# Patient Record
Sex: Female | Born: 1995 | Race: White | Hispanic: No | Marital: Married | Smoking: Never smoker
Health system: Southern US, Community
[De-identification: ages and names within clinical notes are randomized; demographics above are authoritative.]

## PROBLEM LIST (undated history)

## (undated) DIAGNOSIS — E119 Type 2 diabetes mellitus without complications: Secondary | ICD-10-CM

## (undated) DIAGNOSIS — O26649 Intrahepatic cholestasis of pregnancy, unspecified trimester: Secondary | ICD-10-CM

## (undated) HISTORY — DX: Type 2 diabetes mellitus without complications: E11.9

## (undated) HISTORY — PX: TYMPANOSTOMY TUBE PLACEMENT: SHX32

## (undated) HISTORY — PX: DENTAL RESTORATION/EXTRACTION WITH X-RAY: SHX5796

## (undated) HISTORY — PX: OTHER SURGICAL HISTORY: SHX169

---
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
## 2015-11-16 ENCOUNTER — Encounter: Payer: Self-pay | Admitting: Women's Health

## 2015-11-16 ENCOUNTER — Ambulatory Visit (INDEPENDENT_AMBULATORY_CARE_PROVIDER_SITE_OTHER): Payer: 59 | Admitting: Women's Health

## 2015-11-16 VITALS — BP 118/60 | HR 88 | Wt 133.0 lb

## 2015-11-16 DIAGNOSIS — Z30011 Encounter for initial prescription of contraceptive pills: Secondary | ICD-10-CM

## 2015-11-16 DIAGNOSIS — Z3202 Encounter for pregnancy test, result negative: Secondary | ICD-10-CM

## 2015-11-16 DIAGNOSIS — E109 Type 1 diabetes mellitus without complications: Secondary | ICD-10-CM | POA: Insufficient documentation

## 2015-11-16 LAB — POCT URINE PREGNANCY: PREG TEST UR: NEGATIVE

## 2015-11-16 MED ORDER — NORETHIN-ETH ESTRAD-FE BIPHAS 1 MG-10 MCG / 10 MCG PO TABS: 1.0000 | ORAL_TABLET | Freq: Every day | ORAL | Status: DC

## 2015-11-16 NOTE — Patient Instructions (Signed)
Oral Contraception Use Oral contraceptive pills (OCPs) are medicines taken to prevent pregnancy. OCPs work by preventing the ovaries from releasing eggs. The hormones in OCPs also cause the cervical mucus to thicken, preventing the sperm from entering the uterus. The hormones also cause the uterine lining to become thin, not allowing a fertilized egg to attach to the inside of the uterus. OCPs are highly effective when taken exactly as prescribed. However, OCPs do not prevent sexually transmitted diseases (STDs). Safe sex practices, such as using condoms along with an OCP, can help prevent STDs. Before taking OCPs, you may have a physical exam and Pap test. Your health care provider may also order blood tests if necessary. Your health care provider will make sure you are a good candidate for oral contraception. Discuss with your health care provider the possible side effects of the OCP you may be prescribed. When starting an OCP, it can take 2 to 3 months for the body to adjust to the changes in hormone levels in your body.  HOW TO TAKE ORAL CONTRACEPTIVE PILLS Your health care provider may advise you on how to start taking the first cycle of OCPs. Otherwise, you can:   Start on day 1 of your menstrual period. You will not need any backup contraceptive protection with this start time.   Start on the first Sunday after your menstrual period or the day you get your prescription. In these cases, you will need to use backup contraceptive protection for the first week.   Start the pill at any time of your cycle. If you take the pill within 5 days of the start of your period, you are protected against pregnancy right away. In this case, you will not need a backup form of birth control. If you start at any other time of your menstrual cycle, you will need to use another form of birth control for 7 days. If your OCP is the type called a minipill, it will protect you from pregnancy after taking it for 2 days (48  hours). After you have started taking OCPs:   If you forget to take 1 pill, take it as soon as you remember. Take the next pill at the regular time.   If you miss 2 or more pills, call your health care provider because different pills have different instructions for missed doses. Use backup birth control until your next menstrual period starts.   If you use a 28-day pack that contains inactive pills and you miss 1 of the last 7 pills (pills with no hormones), it will not matter. Throw away the rest of the non-hormone pills and start a new pill pack.  No matter which day you start the OCP, you will always start a new pack on that same day of the week. Have an extra pack of OCPs and a backup contraceptive method available in case you miss some pills or lose your OCP pack.  HOME CARE INSTRUCTIONS   Do not smoke.   Always use a condom to protect against STDs. OCPs do not protect against STDs.   Use a calendar to mark your menstrual period days.   Read the information and directions that came with your OCP. Talk to your health care provider if you have questions.  SEEK MEDICAL CARE IF:   You develop nausea and vomiting.   You have abnormal vaginal discharge or bleeding.   You develop a rash.   You miss your menstrual period.   You are losing   your hair.   You need treatment for mood swings or depression.   You get dizzy when taking the OCP.   You develop acne from taking the OCP.   You become pregnant.  SEEK IMMEDIATE MEDICAL CARE IF:   You develop chest pain.   You develop shortness of breath.   You have an uncontrolled or severe headache.   You develop numbness or slurred speech.   You develop visual problems.   You develop pain, redness, and swelling in the legs.    This information is not intended to replace advice given to you by your health care provider. Make sure you discuss any questions you have with your health care provider.   Document  Released: 06/15/2011 Document Revised: 07/17/2014 Document Reviewed: 12/15/2012 Elsevier Interactive Patient Education 2016 [employer]  Ethinyl Estradiol; Norethindrone Acetate tablets (contraception) What is this medicine? ETHINYL ESTRADIOL; NORETHINDRONE ACETATE (ETH in il es tra DYE ole; nor eth IN drone AS e tate) is an oral contraceptive. The products combine two types of female hormones, an estrogen and a progestin. They are used to prevent ovulation and pregnancy. This medicine may be used for other purposes; ask your health care provider or pharmacist if you have questions. What should I tell my health care provider before I take this medicine? They need to know if you have or ever had any of these conditions: -abnormal vaginal bleeding -blood vessel disease or blood clots -breast, cervical, endometrial, ovarian, liver, or uterine cancer -diabetes -gallbladder disease -heart disease or recent heart attack -high blood pressure -high cholesterol -kidney disease -liver disease -migraine headaches -stroke -systemic lupus erythematosus (SLE) -tobacco smoker -an unusual or allergic reaction to estrogens, progestins, other medicines, foods, dyes, or preservatives -pregnant or trying to get pregnant -breast-feeding How should I use this medicine? Take this medicine by mouth. To reduce nausea, this medicine may be taken with food. Follow the directions on the prescription label. Take this medicine at the same time each day and in the order directed on the package. Do not take your medicine more often than directed. Contact your pediatrician regarding the use of this medicine in children. Special care may be needed. This medicine has been used in female children who have started having menstrual periods. A patient package insert for the product will be given with each prescription and refill. Read this sheet carefully each time. The sheet may change frequently. Overdosage: If you  think you have taken too much of this medicine contact a poison control center or emergency room at once. NOTE: This medicine is only for you. Do not share this medicine with others. What if I miss a dose? If you miss a dose, refer to the patient information sheet you received with your medicine for direction. If you miss more than one pill, this medicine may not be as effective and you may need to use another form of birth control. What may interact with this medicine? -acetaminophen -antibiotics or medicines for infections, especially rifampin, rifabutin, rifapentine, and griseofulvin, and possibly penicillins or tetracyclines -aprepitant -ascorbic acid (vitamin C) -atorvastatin -barbiturate medicines, such as phenobarbital -bosentan -carbamazepine -caffeine -clofibrate -cyclosporine -dantrolene -doxercalciferol -felbamate -grapefruit juice -hydrocortisone -medicines for anxiety or sleeping problems, such as diazepam or temazepam -medicines for diabetes, including pioglitazone -mineral oil -modafinil -mycophenolate -nefazodone -oxcarbazepine -phenytoin -prednisolone -ritonavir or other medicines for HIV infection or AIDS -rosuvastatin -selegiline -soy isoflavones supplements -St. John's wort -tamoxifen or raloxifene -theophylline -thyroid hormones -topiramate -warfarin This list may not describe all   possible interactions. Give your health care provider a list of all the medicines, herbs, non-prescription drugs, or dietary supplements you use. Also tell them if you smoke, drink alcohol, or use illegal drugs. Some items may interact with your medicine. What should I watch for while using this medicine? Visit your doctor or health care professional for regular checks on your progress. You will need a regular breast and pelvic exam and Pap smear while on this medicine. Use an additional method of contraception during the first cycle that you take these tablets. If you have  any reason to think you are pregnant, stop taking this medicine right away and contact your doctor or health care professional. If you are taking this medicine for hormone related problems, it may take several cycles of use to see improvement in your condition. Smoking increases the risk of getting a blood clot or having a stroke while you are taking birth control pills, especially if you are more than 21 years old. You are strongly advised not to smoke. This medicine can make your body retain fluid, making your fingers, hands, or ankles swell. Your blood pressure can go up. Contact your doctor or health care professional if you feel you are retaining fluid. This medicine can make you more sensitive to the sun. Keep out of the sun. If you cannot avoid being in the sun, wear protective clothing and use sunscreen. Do not use sun lamps or tanning beds/booths. If you wear contact lenses and notice visual changes, or if the lenses begin to feel uncomfortable, consult your eye care specialist. In some women, tenderness, swelling, or minor bleeding of the gums may occur. Notify your dentist if this happens. Brushing and flossing your teeth regularly may help limit this. See your dentist regularly and inform your dentist of the medicines you are taking. If you are going to have elective surgery, you may need to stop taking this medicine before the surgery. Consult your health care professional for advice. This medicine does not protect you against HIV infection (AIDS) or any other sexually transmitted diseases. What side effects may I notice from receiving this medicine? Side effects that you should report to your doctor or health care professional as soon as possible: -breast tissue changes or discharge -changes in vaginal bleeding during your period or between your periods -chest pain -coughing up blood -dizziness or fainting spells -headaches or migraines -leg, arm or groin pain -severe or sudden  headaches -stomach pain (severe) -sudden shortness of breath -sudden loss of coordination, especially on one side of the body -speech problems -symptoms of vaginal infection like itching, irritation or unusual discharge -tenderness in the upper abdomen -vomiting -weakness or numbness in the arms or legs, especially on one side of the body -yellowing of the eyes or skin Side effects that usually do not require medical attention (report to your doctor or health care professional if they continue or are bothersome): -breakthrough bleeding and spotting that continues beyond the 3 initial cycles of pills -breast tenderness -mood changes, anxiety, depression, frustration, anger, or emotional outbursts -increased sensitivity to sun or ultraviolet light -nausea -skin rash, acne, or brown spots on the skin -weight gain (slight) This list may not describe all possible side effects. Call your doctor for medical advice about side effects. You may report side effects to FDA at 1-800-FDA-1088. Where should I keep my medicine? Keep out of the reach of children. Store at room temperature between 15 and 30 degrees C (59 and 86 degrees F).   Throw away any unused medicine after the expiration date. NOTE: This sheet is a summary. It may not cover all possible information. If you have questions about this medicine, talk to your doctor, pharmacist, or health care provider.    2016, Elsevier/Gold Standard. (2012-11-01 15:35:20)  

## 2015-11-16 NOTE — Progress Notes (Signed)
Patient ID: Joan Gonzalez, female   DOB: [birth year], 20 y.o.   MRN: [id number]   [facility]  Patient name: Joan Gonzalez MRN [id number]  Date of birth: [birth year]  CC & HPI:  Joan Gonzalez is a 20 y.o. G0 Caucasian female presenting today for wanting to get on coc's. Is sexually active, uses condoms, has never been on contraception before. Has regular periods w/ normal flow. Discussed all other options, still desires coc's. Does not smoke, no h/o HTN, DVT/PE, CVA, MI, or migraines w/ aura. Turns 20yo in 10d.   Patient's last menstrual period was 10/20/2015. Pertinent History Reviewed:  Medical & Surgical Hx:   Past medical, surgical, family, and social history reviewed in electronic medical record Medications: Reviewed & Updated - see associated section Allergies: Reviewed in electronic medical record  Objective Findings:  Vitals: BP 118/60 mmHg  Pulse 88  Wt 133 lb (60.328 kg)  LMP 10/20/2015 There is no height on file to calculate BMI.  Physical Examination: General appearance - alert, well appearing, and in no distress HRRR LCTAB  Results for orders placed or performed in visit on 11/16/15 (from the past 24 hour(s))  POCT urine pregnancy   Collection Time: 11/16/15  4:16 PM  Result Value Ref Range   Preg Test, Ur Negative Negative     Assessment & Plan:  A:   Contraception management  P:  Rx LoLoestrin 3pack w/ 3RF  Condoms always for STI prevention  Return in about 3 months (around 02/16/2016) for Pap & physical. and coc f/u  Marge DuncansBooker, Reza Crymes Randall CNM, WHNP-BC 11/16/2015 4:25 PM

## 2016-02-15 ENCOUNTER — Telehealth: Payer: Self-pay | Admitting: Women's Health

## 2016-02-15 NOTE — Telephone Encounter (Signed)
Pt states her BCP this month is going to be $60, last time she got them they were free.  [facility] and she states the discount card that was on file expired last month.  Gave pharmacy and pt the information from the new discount card, pharmacist states once pt activates the card she can run RX through again.  Informed pt and she verbalized understanding.

## 2016-02-15 NOTE — Telephone Encounter (Signed)
Pt called stating that she would like a refill of her birth control and pt would like the generic of her birth control. Please contact pt

## 2016-02-16 ENCOUNTER — Other Ambulatory Visit: Payer: 59 | Admitting: Adult Health

## 2016-04-19 ENCOUNTER — Telehealth: Payer: Self-pay | Admitting: Family Medicine

## 2016-04-19 DIAGNOSIS — Z13220 Encounter for screening for lipoid disorders: Secondary | ICD-10-CM

## 2016-04-19 DIAGNOSIS — R5383 Other fatigue: Secondary | ICD-10-CM

## 2016-04-19 DIAGNOSIS — Z79899 Other long term (current) drug therapy: Secondary | ICD-10-CM

## 2016-04-19 NOTE — Telephone Encounter (Signed)
Pt is requesting lab orders to be sent over for an upcoming physical. Pt is a new pt here and has no previous labs with us in epic.

## 2016-04-19 NOTE — Telephone Encounter (Signed)
Please discuss with the patient his current health issues. According to Epic he is on insulin? I assume he has a endocrinologist? Does he know if the endocrinologist is check cholesterol recently? May need various testing depending on the reply

## 2016-04-19 NOTE — Telephone Encounter (Signed)
Left message return call 04/19/16 

## 2016-04-20 NOTE — Telephone Encounter (Signed)
Yes patient states she takes insulin. She does see a endocrinologist. Patient is not sure if they check her cholesterol. She does not follow up with endocrinology until November.

## 2016-04-20 NOTE — Telephone Encounter (Signed)
I would recommend A1c should be done through her endocrinologist when she sees him in November. As for us I recommend CBC, lipid, liver, metabolic 7, TSH

## 2016-04-21 NOTE — Addendum Note (Signed)
Addended by: Dereck LigasJOHNSON, Tuck Dulworth P on: 04/21/2016 08:09 AM   Modules accepted: Orders

## 2016-04-21 NOTE — Telephone Encounter (Signed)
Left message on voicemail notifying patient that blood work has been ordered.  

## 2016-04-30 LAB — HEPATIC FUNCTION PANEL
ALK PHOS: 55 IU/L (ref 39–117)
ALT: 15 IU/L (ref 0–32)
AST: 17 IU/L (ref 0–40)
Albumin: 4.4 g/dL (ref 3.5–5.5)
BILIRUBIN TOTAL: 0.5 mg/dL (ref 0.0–1.2)
BILIRUBIN, DIRECT: 0.15 mg/dL (ref 0.00–0.40)
Total Protein: 7.1 g/dL (ref 6.0–8.5)

## 2016-04-30 LAB — BASIC METABOLIC PANEL
BUN / CREAT RATIO: 28 — AB (ref 9–23)
BUN: 22 mg/dL — AB (ref 6–20)
CHLORIDE: 98 mmol/L (ref 96–106)
CO2: 22 mmol/L (ref 18–29)
Calcium: 9.7 mg/dL (ref 8.7–10.2)
Creatinine, Ser: 0.8 mg/dL (ref 0.57–1.00)
GFR calc non Af Amer: 106 mL/min/{1.73_m2} (ref >59)
GFR, EST AFRICAN AMERICAN: 122 mL/min/{1.73_m2} (ref >59)
Glucose: 241 mg/dL — ABNORMAL HIGH (ref 65–99)
Potassium: 5 mmol/L (ref 3.5–5.2)
SODIUM: 137 mmol/L (ref 134–144)

## 2016-04-30 LAB — CBC WITH DIFFERENTIAL/PLATELET
BASOS ABS: 0 10*3/uL (ref 0.0–0.2)
Basos: 0 %
EOS (ABSOLUTE): 0.2 10*3/uL (ref 0.0–0.4)
Eos: 3 %
HEMATOCRIT: 41.1 % (ref 34.0–46.6)
HEMOGLOBIN: 13.5 g/dL (ref 11.1–15.9)
Immature Grans (Abs): 0 10*3/uL (ref 0.0–0.1)
Immature Granulocytes: 0 %
LYMPHS ABS: 1.9 10*3/uL (ref 0.7–3.1)
Lymphs: 27 %
MCH: 29.5 pg (ref 26.6–33.0)
MCHC: 32.8 g/dL (ref 31.5–35.7)
MCV: 90 fL (ref 79–97)
MONOCYTES: 6 %
MONOS ABS: 0.4 10*3/uL (ref 0.1–0.9)
NEUTROS ABS: 4.4 10*3/uL (ref 1.4–7.0)
Neutrophils: 64 %
Platelets: 430 10*3/uL — ABNORMAL HIGH (ref 150–379)
RBC: 4.57 x10E6/uL (ref 3.77–5.28)
RDW: 12.7 % (ref 12.3–15.4)
WBC: 6.9 10*3/uL (ref 3.4–10.8)

## 2016-04-30 LAB — TSH

## 2016-04-30 LAB — LIPID PANEL
CHOLESTEROL TOTAL: 211 mg/dL — AB (ref 100–199)
Chol/HDL Ratio: 2.2 ratio units (ref 0.0–4.4)
HDL: 94 mg/dL (ref >39)
LDL Calculated: 97 mg/dL (ref 0–99)
TRIGLYCERIDES: 98 mg/dL (ref 0–149)
VLDL Cholesterol Cal: 20 mg/dL (ref 5–40)

## 2016-05-03 ENCOUNTER — Encounter: Payer: 59 | Admitting: Nurse Practitioner

## 2016-05-10 ENCOUNTER — Telehealth: Payer: Self-pay | Admitting: *Deleted

## 2016-05-10 MED ORDER — NORETHIN ACE-ETH ESTRAD-FE 1-20 MG-MCG(24) PO TABS: 1.0000 | ORAL_TABLET | Freq: Every day | ORAL | 11 refills | Status: DC

## 2016-05-10 NOTE — Telephone Encounter (Signed)
Will rx loestrin 24 fe

## 2016-05-30 ENCOUNTER — Ambulatory Visit (INDEPENDENT_AMBULATORY_CARE_PROVIDER_SITE_OTHER): Payer: PRIVATE HEALTH INSURANCE | Admitting: Nurse Practitioner

## 2016-05-30 ENCOUNTER — Encounter: Payer: Self-pay | Admitting: Nurse Practitioner

## 2016-05-30 VITALS — BP 120/72 | Ht 62.0 in | Wt 139.0 lb

## 2016-05-30 DIAGNOSIS — Z124 Encounter for screening for malignant neoplasm of cervix: Secondary | ICD-10-CM

## 2016-05-30 DIAGNOSIS — Z113 Encounter for screening for infections with a predominantly sexual mode of transmission: Secondary | ICD-10-CM

## 2016-05-30 DIAGNOSIS — Z1151 Encounter for screening for human papillomavirus (HPV): Secondary | ICD-10-CM | POA: Diagnosis not present

## 2016-05-30 DIAGNOSIS — Z0000 Encounter for general adult medical examination without abnormal findings: Secondary | ICD-10-CM | POA: Diagnosis not present

## 2016-06-01 ENCOUNTER — Encounter: Payer: Self-pay | Admitting: Nurse Practitioner

## 2016-06-01 NOTE — Progress Notes (Signed)
   Subjective:    Patient ID: Joan Gonzalez, female    DOB: 31-May-1996, 20 y.o.   MRN: [id number]  HPI Presents for her wellness exam. Has insulin pump. Sees her endocrinologist every 3 months. Married; has been a long term partner. Cycles very light on current pill. No missed pills. No pelvic pain or discharge. Regular eye exams. Needs dental exam. Walking for activity.     Review of Systems  Constitutional: Negative for activity change, appetite change and fatigue.  HENT: Negative for dental problem, ear pain, sinus pressure and sore throat.   Respiratory: Negative for cough, chest tightness, shortness of breath and wheezing.   Cardiovascular: Negative for chest pain.  Gastrointestinal: Negative for abdominal distention, abdominal pain, constipation, diarrhea, nausea and vomiting.  Genitourinary: Negative for difficulty urinating, dysuria, enuresis, frequency, genital sores, menstrual problem, pelvic pain, urgency and vaginal discharge.       Objective:   Physical Exam  Constitutional: She is oriented to person, place, and time. She appears well-developed. No distress.  HENT:  Right Ear: External ear normal.  Left Ear: External ear normal.  Mouth/Throat: Oropharynx is clear and moist.  Neck: Normal range of motion. Neck supple. No tracheal deviation present. No thyromegaly present.  Cardiovascular: Normal rate, regular rhythm and normal heart sounds.  Exam reveals no gallop.   No murmur heard. Pulmonary/Chest: Effort normal and breath sounds normal.  Abdominal: Soft. She exhibits no distension. There is no tenderness.  Genitourinary: Vagina normal and uterus normal. No vaginal discharge found.  Genitourinary Comments: External GU: no rash or lesions. Vagina: no discharge. Cervix normal in appearance. No CMT. Bimanual exam: no tenderness or obvious masses.   Musculoskeletal: She exhibits no edema.  Lymphadenopathy:    She has no cervical adenopathy.  Neurological: She is alert  and oriented to person, place, and time.  Skin: Skin is warm and dry. No rash noted.  Psychiatric: She has a normal mood and affect. Her behavior is normal.  Vitals reviewed. Breast exam: dense tissue with some small cystic areas. No masses. Axillae no adenopathy.         Assessment & Plan:  Routine general medical examination at a health care facility - Plan: Pap IG, CT/NG NAA, and HPV (high risk)  Screening for cervical cancer - Plan: Pap IG, CT/NG NAA, and HPV (high risk)  Screen for STD (sexually transmitted disease) - Plan: Pap IG, CT/NG NAA, and HPV (high risk)  Screening for HPV (human papillomavirus) - Plan: Pap IG, CT/NG NAA, and HPV (high risk)  Encouraged healthy diet and activity.  Return in about 1 year (around 05/30/2017) for physical.

## 2016-06-06 LAB — PAP IG, CT-NG NAA, HPV HIGH-RISK
Chlamydia, Nuc. Acid Amp: NEGATIVE
Gonococcus by Nucleic Acid Amp: NEGATIVE
HPV, high-risk: NEGATIVE
PAP SMEAR COMMENT: 0

## 2016-06-07 ENCOUNTER — Encounter: Payer: PRIVATE HEALTH INSURANCE | Attending: Internal Medicine | Admitting: *Deleted

## 2016-06-07 DIAGNOSIS — E109 Type 1 diabetes mellitus without complications: Secondary | ICD-10-CM

## 2016-06-12 NOTE — Progress Notes (Signed)
Pump Upgrade Training: Appt start time: 1530 end time: 1700.   Assessment: Primary concerns today: Patient here for upgrade to Medtronic 670G  insulin pump Manual Mode. She does not have the G3 Sensors yet, they should be shipped in January. Patient has not completed training modules from [employer]. She states her comfort with Carb Counting is 7/10. She is here with her mother and her husband   Medications: see list. Insulin used in pump is Humalog  Intervention:  Pump settings transferred directly from current pump to new pump by patient New meter ID added to pump for communicating Reviewed new features of pump with patient Patient is not signed up on [facility] Web Site Patient expressed understanding of info taught and is wearing pump by end of visit. Patient informed of DM 1/PUmp Support Group  Follow Up  Patient to upload to [facility] within 14 days and notify me to assess for any pump setting changes.

## 2017-02-05 ENCOUNTER — Encounter: Payer: Self-pay | Admitting: Nurse Practitioner

## 2017-02-05 ENCOUNTER — Ambulatory Visit (INDEPENDENT_AMBULATORY_CARE_PROVIDER_SITE_OTHER): Payer: PRIVATE HEALTH INSURANCE | Admitting: Nurse Practitioner

## 2017-02-05 VITALS — BP 132/86 | Ht 62.5 in | Wt 146.0 lb

## 2017-02-05 DIAGNOSIS — N921 Excessive and frequent menstruation with irregular cycle: Secondary | ICD-10-CM

## 2017-02-05 DIAGNOSIS — R42 Dizziness and giddiness: Secondary | ICD-10-CM

## 2017-02-05 DIAGNOSIS — Z3009 Encounter for other general counseling and advice on contraception: Secondary | ICD-10-CM

## 2017-02-05 LAB — POCT HEMOGLOBIN: HEMOGLOBIN: 13 g/dL (ref 12.2–16.2)

## 2017-02-05 LAB — POCT URINE PREGNANCY: PREG TEST UR: NEGATIVE

## 2017-02-05 NOTE — Patient Instructions (Addendum)
Depo Provera Nexplanon Kyleena Nuvaring  Call and schedule nurse visit during first 4-5 days of cycle for depo Provera injection   Medroxyprogesterone injection [Contraceptive] What is this medicine? MEDROXYPROGESTERONE (me DROX ee proe JES te rone) contraceptive injections prevent pregnancy. They provide effective birth control for 3 months. Depo-subQ Provera 104 is also used for treating pain related to endometriosis. This medicine may be used for other purposes; ask your health care provider or pharmacist if you have questions. COMMON BRAND NAME(S): Depo-Provera, Depo-subQ Provera 104 What should I tell my health care provider before I take this medicine? They need to know if you have any of these conditions: -frequently drink alcohol -asthma -blood vessel disease or a history of a blood clot in the lungs or legs -bone disease such as osteoporosis -breast cancer -diabetes -eating disorder (anorexia nervosa or bulimia) -high blood pressure -HIV infection or AIDS -kidney disease -liver disease -mental depression -migraine -seizures (convulsions) -stroke -tobacco smoker -vaginal bleeding -an unusual or allergic reaction to medroxyprogesterone, other hormones, medicines, foods, dyes, or preservatives -pregnant or trying to get pregnant -breast-feeding How should I use this medicine? Depo-Provera Contraceptive injection is given into a muscle. Depo-subQ Provera 104 injection is given under the skin. These injections are given by a health care professional. You must not be pregnant before getting an injection. The injection is usually given during the first 5 days after the start of a menstrual period or 6 weeks after delivery of a baby. Talk to your pediatrician regarding the use of this medicine in children. Special care may be needed. These injections have been used in female children who have started having menstrual periods. Overdosage: If you think you have taken too much of  this medicine contact a poison control center or emergency room at once. NOTE: This medicine is only for you. Do not share this medicine with others. What if I miss a dose? Try not to miss a dose. You must get an injection once every 3 months to maintain birth control. If you cannot keep an appointment, call and reschedule it. If you wait longer than 13 weeks between Depo-Provera contraceptive injections or longer than 14 weeks between Depo-subQ Provera 104 injections, you could get pregnant. Use another method for birth control if you miss your appointment. You may also need a pregnancy test before receiving another injection. What may interact with this medicine? Do not take this medicine with any of the following medications: -bosentan This medicine may also interact with the following medications: -aminoglutethimide -antibiotics or medicines for infections, especially rifampin, rifabutin, rifapentine, and griseofulvin -aprepitant -barbiturate medicines such as phenobarbital or primidone -bexarotene -carbamazepine -medicines for seizures like ethotoin, felbamate, oxcarbazepine, phenytoin, topiramate -modafinil -St. John's wort This list may not describe all possible interactions. Give your health care provider a list of all the medicines, herbs, non-prescription drugs, or dietary supplements you use. Also tell them if you smoke, drink alcohol, or use illegal drugs. Some items may interact with your medicine. What should I watch for while using this medicine? This drug does not protect you against HIV infection (AIDS) or other sexually transmitted diseases. Use of this product may cause you to lose calcium from your bones. Loss of calcium may cause weak bones (osteoporosis). Only use this product for more than 2 years if other forms of birth control are not right for you. The longer you use this product for birth control the more likely you will be at risk for weak bones. Ask your health care  professional how you can keep strong bones. You may have a change in bleeding pattern or irregular periods. Many females stop having periods while taking this drug. If you have received your injections on time, your chance of being pregnant is very low. If you think you may be pregnant, see your health care professional as soon as possible. Tell your health care professional if you want to get pregnant within the next year. The effect of this medicine may last a long time after you get your last injection. What side effects may I notice from receiving this medicine? Side effects that you should report to your doctor or health care professional as soon as possible: -allergic reactions like skin rash, itching or hives, swelling of the face, lips, or tongue -breast tenderness or discharge -breathing problems -changes in vision -depression -feeling faint or lightheaded, falls -fever -pain in the abdomen, chest, groin, or leg -problems with balance, talking, walking -unusually weak or tired -yellowing of the eyes or skin Side effects that usually do not require medical attention (report to your doctor or health care professional if they continue or are bothersome): -acne -fluid retention and swelling -headache -irregular periods, spotting, or absent periods -temporary pain, itching, or skin reaction at site where injected -weight gain This list may not describe all possible side effects. Call your doctor for medical advice about side effects. You may report side effects to FDA at 1-800-FDA-1088. Where should I keep my medicine? This does not apply. The injection will be given to you by a health care professional. NOTE: This sheet is a summary. It may not cover all possible information. If you have questions about this medicine, talk to your doctor, pharmacist, or health care provider.  2018 Elsevier/Gold Standard (2008-07-17 18:37:56)

## 2017-02-06 ENCOUNTER — Encounter: Payer: Self-pay | Admitting: Nurse Practitioner

## 2017-02-06 NOTE — Progress Notes (Signed)
Subjective:  Presents for c/o BTB and spotting on oc's. Has missed some pills. Last normal menses was 1-2 weeks ago on time. Married, same partner 1-2 years. Had intercourse last night. [facility] Very mild dizziness. No syncope. Type I diabetes with insulin pump. Readings on her pump indicate low BS around 60 on occasion.   Objective:   BP 132/86   Ht 5' 2.5" (1.588 m)   Wt 146 lb (66.2 kg)   BMI 26.28 kg/m  NAD. Alert, oriented. Lungs clear. Heart RRR.  Results for orders placed or performed in visit on 02/05/17  POCT urine pregnancy  Result Value Ref Range   Preg Test, Ur Negative Negative  POCT hemoglobin  Result Value Ref Range   Hemoglobin 13.0 12.2 - 16.2 g/dL     Assessment:  Breakthrough bleeding on birth control pills - Plan: POCT urine pregnancy, POCT hemoglobin  Dizziness - Plan: POCT urine pregnancy, POCT hemoglobin  Encounter for general counseling and advice on contraceptive management     Plan:  Reviewed contraceptive measures. Wishes to try Depo Provera. Because of recent intercourse and history of missing oc's, patient to return within 4 days of her next normal menses for Depo Provera. Use condoms as back up method in meantime. Otherwise follow up with endocrinologist as planned for diabetes.

## 2017-02-16 ENCOUNTER — Ambulatory Visit (INDEPENDENT_AMBULATORY_CARE_PROVIDER_SITE_OTHER): Payer: PRIVATE HEALTH INSURANCE | Admitting: *Deleted

## 2017-02-16 DIAGNOSIS — Z309 Encounter for contraceptive management, unspecified: Secondary | ICD-10-CM | POA: Diagnosis not present

## 2017-02-16 MED ORDER — MEDROXYPROGESTERONE ACETATE 150 MG/ML IM SUSP
150.0000 mg | Freq: Once | INTRAMUSCULAR | Status: AC
Start: 2017-02-16 — End: 2017-02-16
  Administered 2017-02-16: 150 mg via INTRAMUSCULAR

## 2017-03-08 DIAGNOSIS — E1065 Type 1 diabetes mellitus with hyperglycemia: Secondary | ICD-10-CM | POA: Diagnosis not present

## 2017-03-27 ENCOUNTER — Ambulatory Visit (INDEPENDENT_AMBULATORY_CARE_PROVIDER_SITE_OTHER): Payer: PRIVATE HEALTH INSURANCE

## 2017-03-27 DIAGNOSIS — Z111 Encounter for screening for respiratory tuberculosis: Secondary | ICD-10-CM | POA: Diagnosis not present

## 2017-03-29 LAB — TB SKIN TEST
Induration: 0 mm
TB SKIN TEST: NEGATIVE

## 2017-04-12 DIAGNOSIS — Z794 Long term (current) use of insulin: Secondary | ICD-10-CM | POA: Diagnosis not present

## 2017-04-12 DIAGNOSIS — E1065 Type 1 diabetes mellitus with hyperglycemia: Secondary | ICD-10-CM | POA: Diagnosis not present

## 2017-04-17 DIAGNOSIS — H52223 Regular astigmatism, bilateral: Secondary | ICD-10-CM | POA: Diagnosis not present

## 2017-04-17 DIAGNOSIS — H5213 Myopia, bilateral: Secondary | ICD-10-CM | POA: Diagnosis not present

## 2017-04-18 ENCOUNTER — Encounter: Payer: PRIVATE HEALTH INSURANCE | Admitting: Nurse Practitioner

## 2017-05-04 ENCOUNTER — Ambulatory Visit: Payer: PRIVATE HEALTH INSURANCE

## 2017-05-04 ENCOUNTER — Ambulatory Visit (INDEPENDENT_AMBULATORY_CARE_PROVIDER_SITE_OTHER): Payer: PRIVATE HEALTH INSURANCE | Admitting: Nurse Practitioner

## 2017-05-04 ENCOUNTER — Encounter: Payer: Self-pay | Admitting: Nurse Practitioner

## 2017-05-04 VITALS — BP 118/70 | Ht 62.5 in | Wt 146.0 lb

## 2017-05-04 DIAGNOSIS — Z0000 Encounter for general adult medical examination without abnormal findings: Secondary | ICD-10-CM

## 2017-05-04 DIAGNOSIS — Z23 Encounter for immunization: Secondary | ICD-10-CM | POA: Diagnosis not present

## 2017-05-04 DIAGNOSIS — Z3042 Encounter for surveillance of injectable contraceptive: Secondary | ICD-10-CM

## 2017-05-04 MED ORDER — MEDROXYPROGESTERONE ACETATE 150 MG/ML IM SUSP
150.0000 mg | Freq: Once | INTRAMUSCULAR | Status: AC
  Administered 2017-05-04: 150 mg via INTRAMUSCULAR

## 2017-05-04 NOTE — Progress Notes (Signed)
   Subjective:    Patient ID: Joan Gonzalez D Alix, female    DOB: Oct 30, 1995, 21 y.o.   MRN: [id number]  HPI presents for her wellness exam.  Husband is present per her request.  Defers pelvic exam today, had a normal Pap smear last year.  Same sexual partner.  Currently on Depo-Provera, no bleeding or pelvic pain.  Regular eye exams.  Needs a dental exam.  Minimal activity.  Sees Dr. Sharl MaKerr and endocrinologist for her type 1 diabetes.  All of her blood work and screening is done through that office.    Review of Systems  Constitutional: Negative for activity change, appetite change and fatigue.  HENT: Negative for dental problem, ear pain, sinus pressure and sore throat.   Respiratory: Negative for cough, chest tightness, shortness of breath and wheezing.   Cardiovascular: Negative for chest pain.  Gastrointestinal: Negative for abdominal distention, abdominal pain, constipation, diarrhea, nausea and vomiting.  Genitourinary: Negative for difficulty urinating, dysuria, enuresis, frequency, genital sores, pelvic pain, urgency, vaginal bleeding and vaginal discharge.   Depression screen PHQ 2/9 05/04/2017  Decreased Interest 0  Down, Depressed, Hopeless 0  PHQ - 2 Score 0        Objective:   Physical Exam  Constitutional: She is oriented to person, place, and time. She appears well-developed. No distress.  HENT:  Right Ear: External ear normal.  Left Ear: External ear normal.  Mouth/Throat: Oropharynx is clear and moist.  Neck: Normal range of motion. Neck supple. No tracheal deviation present. No thyromegaly present.  Cardiovascular: Normal rate, regular rhythm and normal heart sounds.  Exam reveals no gallop.   No murmur heard. Pulmonary/Chest: Effort normal and breath sounds normal. Right breast exhibits no inverted nipple, no mass, no skin change and no tenderness. Left breast exhibits no inverted nipple, no mass, no skin change and no tenderness. Breasts are symmetrical.  Axillae no  adenopathy.   Abdominal: Soft. She exhibits no distension. There is no tenderness.  Genitourinary:  Genitourinary Comments: Defers pelvic and GU exam. Denies any problems.  Lymphadenopathy:    She has no cervical adenopathy.  Neurological: She is alert and oriented to person, place, and time.  Skin: Skin is warm and dry. No rash noted.  Psychiatric: She has a normal mood and affect. Her behavior is normal.  Vitals reviewed.         Assessment & Plan:  Annual physical exam  Need for influenza vaccination - Plan: Flu Vaccine QUAD 36+ mos IM  Encounter for surveillance of injectable contraceptive - Plan: medroxyPROGESTERone (DEPO-PROVERA) injection 150 mg  Encouraged healthy diet and regular activity. Continue follow up with endocrinologist.  Return in about 1 year (around 05/04/2018) for physical.

## 2017-05-28 DIAGNOSIS — E1065 Type 1 diabetes mellitus with hyperglycemia: Secondary | ICD-10-CM | POA: Diagnosis not present

## 2017-06-11 ENCOUNTER — Encounter: Payer: PRIVATE HEALTH INSURANCE | Admitting: Nurse Practitioner

## 2017-06-19 ENCOUNTER — Telehealth: Payer: Self-pay | Admitting: *Deleted

## 2017-06-19 NOTE — Telephone Encounter (Signed)
On second depo provera. Still having bleeding. Bleeding for 3 weeks straight. Can something be called in for bleeding. [facility] [place]

## 2017-06-20 ENCOUNTER — Other Ambulatory Visit: Payer: Self-pay | Admitting: Nurse Practitioner

## 2017-06-20 MED ORDER — MEGESTROL ACETATE 40 MG PO TABS: ORAL_TABLET | ORAL | 0 refills | Status: DC

## 2017-06-20 NOTE — Telephone Encounter (Signed)
Just sent in Megace. Follow the instructions on how to take it. Call back in one week if bleeding has not stopped. Also let me know if it comes back after completing medication.

## 2017-06-20 NOTE — Telephone Encounter (Signed)
Discussed with pt. Pt verbalized understanding.  °

## 2017-06-20 NOTE — Telephone Encounter (Signed)
Left message to return call 

## 2017-07-20 ENCOUNTER — Ambulatory Visit (INDEPENDENT_AMBULATORY_CARE_PROVIDER_SITE_OTHER): Payer: PRIVATE HEALTH INSURANCE | Admitting: *Deleted

## 2017-07-20 DIAGNOSIS — Z3042 Encounter for surveillance of injectable contraceptive: Secondary | ICD-10-CM

## 2017-07-20 MED ORDER — MEDROXYPROGESTERONE ACETATE 150 MG/ML IM SUSP
150.0000 mg | Freq: Once | INTRAMUSCULAR | Status: AC
  Administered 2017-07-20: 150 mg via INTRAMUSCULAR

## 2017-10-05 ENCOUNTER — Ambulatory Visit (INDEPENDENT_AMBULATORY_CARE_PROVIDER_SITE_OTHER): Payer: PRIVATE HEALTH INSURANCE | Admitting: *Deleted

## 2017-10-05 DIAGNOSIS — Z3042 Encounter for surveillance of injectable contraceptive: Secondary | ICD-10-CM | POA: Diagnosis not present

## 2017-10-05 MED ORDER — MEDROXYPROGESTERONE ACETATE 150 MG/ML IM SUSP
150.0000 mg | Freq: Once | INTRAMUSCULAR | Status: AC
  Administered 2017-10-05: 150 mg via INTRAMUSCULAR

## 2017-12-13 ENCOUNTER — Ambulatory Visit: Payer: PRIVATE HEALTH INSURANCE | Admitting: Family Medicine

## 2017-12-21 ENCOUNTER — Ambulatory Visit (INDEPENDENT_AMBULATORY_CARE_PROVIDER_SITE_OTHER): Payer: PRIVATE HEALTH INSURANCE | Admitting: *Deleted

## 2017-12-21 DIAGNOSIS — Z3042 Encounter for surveillance of injectable contraceptive: Secondary | ICD-10-CM

## 2017-12-21 DIAGNOSIS — IMO0001 Reserved for inherently not codable concepts without codable children: Secondary | ICD-10-CM

## 2017-12-21 MED ORDER — MEDROXYPROGESTERONE ACETATE 150 MG/ML IM SUSP
150.0000 mg | Freq: Once | INTRAMUSCULAR | Status: AC
  Administered 2017-12-21: 150 mg via INTRAMUSCULAR

## 2018-03-08 ENCOUNTER — Ambulatory Visit (INDEPENDENT_AMBULATORY_CARE_PROVIDER_SITE_OTHER): Payer: PRIVATE HEALTH INSURANCE | Admitting: *Deleted

## 2018-03-08 DIAGNOSIS — Z3042 Encounter for surveillance of injectable contraceptive: Secondary | ICD-10-CM

## 2018-03-08 MED ORDER — MEDROXYPROGESTERONE ACETATE 150 MG/ML IM SUSP
150.0000 mg | Freq: Once | INTRAMUSCULAR | Status: AC
  Administered 2018-03-08: 150 mg via INTRAMUSCULAR

## 2018-05-06 ENCOUNTER — Ambulatory Visit (INDEPENDENT_AMBULATORY_CARE_PROVIDER_SITE_OTHER): Payer: PRIVATE HEALTH INSURANCE | Admitting: Family Medicine

## 2018-05-06 ENCOUNTER — Encounter: Payer: Self-pay | Admitting: Family Medicine

## 2018-05-06 VITALS — BP 110/78 | Ht 62.0 in | Wt 148.4 lb

## 2018-05-06 DIAGNOSIS — Z23 Encounter for immunization: Secondary | ICD-10-CM | POA: Diagnosis not present

## 2018-05-06 DIAGNOSIS — Z0000 Encounter for general adult medical examination without abnormal findings: Secondary | ICD-10-CM

## 2018-05-06 NOTE — Progress Notes (Signed)
   Subjective:    Patient ID: Joan Gonzalez, female    DOB: Dec 04, 1995, 22 y.o.   MRN: [id number]  HPI The patient comes in today for a wellness visit. Patient stays active with her job at times a little anxious and stressed but denies being depressed   A review of their health history was completed.  A review of medications was also completed.  Any needed refills; none   Eating habits: not the healthiest but not the worst  Falls/  MVA accidents in past few months: none  Regular exercise: no  Specialist pt sees on regular basis: endocrinologist   Preventative health issues were discussed.   Additional concerns: none  Patient states diet could be better she does see her endocrinologist on a regular basis states her A1c is in the 8 range I encouraged her to try to get it down to 7 or less Review of Systems  Constitutional: Negative for activity change, appetite change and fatigue.  HENT: Negative for congestion and rhinorrhea.   Eyes: Negative for discharge.  Respiratory: Negative for cough, chest tightness and wheezing.   Cardiovascular: Negative for chest pain.  Gastrointestinal: Negative for abdominal pain, blood in stool and vomiting.  Endocrine: Negative for polyphagia.  Genitourinary: Negative for difficulty urinating and frequency.  Musculoskeletal: Negative for neck pain.  Skin: Negative for color change.  Allergic/Immunologic: Negative for environmental allergies and food allergies.  Neurological: Negative for weakness and headaches.  Psychiatric/Behavioral: Negative for agitation and behavioral problems.       Objective:   Physical Exam  Constitutional: She is oriented to person, place, and time. She appears well-developed and well-nourished.  HENT:  Head: Normocephalic.  Right Ear: External ear normal.  Left Ear: External ear normal.  Eyes: Pupils are equal, round, and reactive to light.  Neck: Normal range of motion. No thyromegaly present.    Cardiovascular: Normal rate, regular rhythm, normal heart sounds and intact distal pulses.  No murmur heard. Pulmonary/Chest: Effort normal and breath sounds normal. No respiratory distress. She has no wheezes.  Abdominal: Soft. Bowel sounds are normal. She exhibits no distension and no mass. There is no tenderness.  Musculoskeletal: Normal range of motion. She exhibits no edema or tenderness.  Lymphadenopathy:    She has no cervical adenopathy.  Neurological: She is alert and oriented to person, place, and time. She exhibits normal muscle tone.  Skin: Skin is warm and dry.  Psychiatric: She has a normal mood and affect. Her behavior is normal.    Breast exam bilateral normal Pap smear not indicated     Assessment & Plan:  Adult wellness-complete.wellness physical was conducted today. Importance of diet and exercise were discussed in detail.  In addition to this a discussion regarding safety was also covered. We also reviewed over immunizations and gave recommendations regarding current immunization needed for age.  In addition to this additional areas were also touched on including: Preventative health exams needed: Patient gets extensive lab work through her endocrinologist who follows her type 1 diabetes Colonoscopy not indicated  Patient was advised yearly wellness exam

## 2018-05-27 ENCOUNTER — Ambulatory Visit (INDEPENDENT_AMBULATORY_CARE_PROVIDER_SITE_OTHER): Payer: PRIVATE HEALTH INSURANCE | Admitting: *Deleted

## 2018-05-27 DIAGNOSIS — Z3042 Encounter for surveillance of injectable contraceptive: Secondary | ICD-10-CM

## 2018-05-27 DIAGNOSIS — Z3009 Encounter for other general counseling and advice on contraception: Secondary | ICD-10-CM

## 2018-05-27 MED ORDER — MEDROXYPROGESTERONE ACETATE 150 MG/ML IM SUSP
150.0000 mg | Freq: Once | INTRAMUSCULAR | Status: AC
  Administered 2018-05-27: 150 mg via INTRAMUSCULAR

## 2018-05-29 ENCOUNTER — Ambulatory Visit: Payer: PRIVATE HEALTH INSURANCE

## 2018-06-24 ENCOUNTER — Encounter: Payer: Self-pay | Admitting: Family Medicine

## 2018-06-24 ENCOUNTER — Ambulatory Visit (INDEPENDENT_AMBULATORY_CARE_PROVIDER_SITE_OTHER): Payer: PRIVATE HEALTH INSURANCE | Admitting: Family Medicine

## 2018-06-24 VITALS — BP 120/78 | Ht 62.0 in | Wt 148.0 lb

## 2018-06-24 DIAGNOSIS — J019 Acute sinusitis, unspecified: Secondary | ICD-10-CM

## 2018-06-24 MED ORDER — AMOXICILLIN 500 MG PO CAPS: 500.0000 mg | ORAL_CAPSULE | Freq: Three times a day (TID) | ORAL | 0 refills | Status: AC

## 2018-06-24 NOTE — Progress Notes (Signed)
   Subjective:    Patient ID: Joan Gonzalez, female    DOB: [birth year], 22 y.o.   MRN: [id number]  HPI Patient is here today with complaints of bilateral eye irritation. She states she woke up on Sunday with a crusting over her left eye. Today both eyes are crusted over. She says they itched yesterday,but not today.She also is complaining of a cough,runny nose and bilateral ear pain. She has not taken any medication other that Otc eye drops and one ibuprofen.  Reports Saturday woke up with left eye crusted over and yesterday woke up with both eyes crusted over. Reports some itching and pain. Denies fever. Reports congestion and ear pain. Reports PND, Denies sore throat. Reports cough sometimes productive green/yellow mucous. Reports URI symptoms have been ongoing for about 2 weeks.   Review of Systems  Constitutional: Negative for fever.  HENT: Positive for congestion, ear pain and postnasal drip. Negative for sore throat.   Eyes: Positive for discharge, redness and itching. Negative for visual disturbance.  Respiratory: Positive for cough. Negative for shortness of breath and wheezing.        Objective:   Physical Exam Vitals signs and nursing note reviewed.  Constitutional:      General: She is not in acute distress.    Appearance: Normal appearance. She is not toxic-appearing.  HENT:     Head: Normocephalic and atraumatic.     Right Ear: Tympanic membrane is retracted.     Left Ear: Tympanic membrane is retracted.     Nose: Congestion present. No nasal tenderness.     Mouth/Throat:     Mouth: Mucous membranes are moist.     Pharynx: Oropharynx is clear.  Eyes:     General: Lids are normal.        Right eye: No discharge.        Left eye: No discharge.     Conjunctiva/sclera:     Right eye: Right conjunctiva is injected. No exudate.    Left eye: Left conjunctiva is injected. No exudate.    Pupils: Pupils are equal, round, and reactive to light.  Neck:     Musculoskeletal:  Neck supple.  Cardiovascular:     Rate and Rhythm: Normal rate and regular rhythm.     Heart sounds: Normal heart sounds.  Pulmonary:     Effort: Pulmonary effort is normal. No respiratory distress.     Breath sounds: Normal breath sounds. No wheezing.  Lymphadenopathy:     Cervical: No cervical adenopathy.  Skin:    General: Skin is warm and dry.  Neurological:     Mental Status: She is alert and oriented to person, place, and time.           Assessment & Plan:  Acute rhinosinusitis  Will go ahead and treat with amoxicillin x 10 days. Instructed patient if noticing increased purulent eye discharge over the next few days to notify us and we will send in abx eye drops at that time. Otherwise may use cool compresses and otc eye drops prn. Warning signs discussed, f/u if symptoms worsen or fail to improve.  Dr. Lilyan PuntScott Luking was consulted on this case and is in agreement with the above treatment plan.

## 2018-08-13 ENCOUNTER — Ambulatory Visit (INDEPENDENT_AMBULATORY_CARE_PROVIDER_SITE_OTHER): Payer: PRIVATE HEALTH INSURANCE | Admitting: *Deleted

## 2018-08-13 DIAGNOSIS — Z3042 Encounter for surveillance of injectable contraceptive: Secondary | ICD-10-CM

## 2018-08-13 MED ORDER — MEDROXYPROGESTERONE ACETATE 150 MG/ML IM SUSP
150.0000 mg | Freq: Once | INTRAMUSCULAR | Status: AC
  Administered 2018-08-13: 150 mg via INTRAMUSCULAR

## 2018-08-25 ENCOUNTER — Emergency Department (HOSPITAL_COMMUNITY)
Admission: EM | Admit: 2018-08-25 | Discharge: 2018-08-26 | Disposition: A | Discharge status: Home / Self Care | Payer: PRIVATE HEALTH INSURANCE | Attending: Emergency Medicine | Admitting: Emergency Medicine

## 2018-08-25 ENCOUNTER — Other Ambulatory Visit: Payer: Self-pay

## 2018-08-25 ENCOUNTER — Encounter (HOSPITAL_COMMUNITY): Payer: Self-pay | Admitting: Emergency Medicine

## 2018-08-25 DIAGNOSIS — E109 Type 1 diabetes mellitus without complications: Secondary | ICD-10-CM | POA: Insufficient documentation

## 2018-08-25 DIAGNOSIS — K529 Noninfective gastroenteritis and colitis, unspecified: Secondary | ICD-10-CM

## 2018-08-25 DIAGNOSIS — Z794 Long term (current) use of insulin: Secondary | ICD-10-CM | POA: Diagnosis not present

## 2018-08-25 DIAGNOSIS — R1011 Right upper quadrant pain: Secondary | ICD-10-CM | POA: Diagnosis present

## 2018-08-25 DIAGNOSIS — R030 Elevated blood-pressure reading, without diagnosis of hypertension: Secondary | ICD-10-CM | POA: Insufficient documentation

## 2018-08-25 DIAGNOSIS — I880 Nonspecific mesenteric lymphadenitis: Secondary | ICD-10-CM | POA: Diagnosis not present

## 2018-08-25 DIAGNOSIS — Z79899 Other long term (current) drug therapy: Secondary | ICD-10-CM | POA: Insufficient documentation

## 2018-08-25 LAB — CBG MONITORING, ED: Glucose-Capillary: 117 mg/dL — ABNORMAL HIGH (ref 70–99)

## 2018-08-25 MED ORDER — SODIUM CHLORIDE 0.9% FLUSH: 3.0000 mL | Freq: Once | INTRAVENOUS | Status: DC

## 2018-08-25 MED ORDER — SODIUM CHLORIDE 0.9 % IV SOLN: 1000.0000 mL | INTRAVENOUS | Status: DC

## 2018-08-25 MED ORDER — ONDANSETRON HCL 4 MG/2ML IJ SOLN
4.0000 mg | Freq: Once | INTRAMUSCULAR | Status: AC
  Administered 2018-08-26: 4 mg via INTRAVENOUS
  Filled 2018-08-25: qty 2

## 2018-08-25 MED ORDER — SODIUM CHLORIDE 0.9 % IV BOLUS (SEPSIS)
1000.0000 mL | Freq: Once | INTRAVENOUS | Status: AC
  Administered 2018-08-26: 1000 mL via INTRAVENOUS

## 2018-08-25 MED ORDER — HYDROMORPHONE HCL 1 MG/ML IJ SOLN
0.5000 mg | Freq: Once | INTRAMUSCULAR | Status: AC
  Administered 2018-08-26: 0.5 mg via INTRAVENOUS
  Filled 2018-08-25: qty 1

## 2018-08-25 NOTE — ED Triage Notes (Signed)
Abd pain under rt breast on and off since last night

## 2018-08-26 ENCOUNTER — Emergency Department (HOSPITAL_COMMUNITY): Payer: PRIVATE HEALTH INSURANCE

## 2018-08-26 DIAGNOSIS — K529 Noninfective gastroenteritis and colitis, unspecified: Secondary | ICD-10-CM | POA: Diagnosis not present

## 2018-08-26 LAB — URINALYSIS, ROUTINE W REFLEX MICROSCOPIC
Bacteria, UA: NONE SEEN
Bilirubin Urine: NEGATIVE
Glucose, UA: >=500 mg/dL — AB
Hgb urine dipstick: NEGATIVE
KETONES UR: 80 mg/dL — AB
Leukocytes,Ua: NEGATIVE
Nitrite: NEGATIVE
Protein, ur: NEGATIVE mg/dL
Specific Gravity, Urine: 1.033 — ABNORMAL HIGH (ref 1.005–1.030)
pH: 5 (ref 5.0–8.0)

## 2018-08-26 LAB — COMPREHENSIVE METABOLIC PANEL
ALT: 17 U/L (ref 0–44)
AST: 22 U/L (ref 15–41)
Albumin: 4.2 g/dL (ref 3.5–5.0)
Alkaline Phosphatase: 79 U/L (ref 38–126)
Anion gap: 10 (ref 5–15)
BUN: 18 mg/dL (ref 6–20)
CO2: 19 mmol/L — ABNORMAL LOW (ref 22–32)
Calcium: 8.9 mg/dL (ref 8.9–10.3)
Chloride: 107 mmol/L (ref 98–111)
Creatinine, Ser: 0.73 mg/dL (ref 0.44–1.00)
GFR calc Af Amer: >60 mL/min (ref >60)
Glucose, Bld: 248 mg/dL — ABNORMAL HIGH (ref 70–99)
Potassium: 3.9 mmol/L (ref 3.5–5.1)
Sodium: 136 mmol/L (ref 135–145)
Total Bilirubin: 0.7 mg/dL (ref 0.3–1.2)
Total Protein: 7.2 g/dL (ref 6.5–8.1)

## 2018-08-26 LAB — CBC
HCT: 42 % (ref 36.0–46.0)
Hemoglobin: 13.6 g/dL (ref 12.0–15.0)
MCH: 28.2 pg (ref 26.0–34.0)
MCHC: 32.4 g/dL (ref 30.0–36.0)
MCV: 87 fL (ref 80.0–100.0)
Platelets: 345 10*3/uL (ref 150–400)
RBC: 4.83 MIL/uL (ref 3.87–5.11)
RDW: 12.3 % (ref 11.5–15.5)
WBC: 10 10*3/uL (ref 4.0–10.5)
nRBC: 0 % (ref 0.0–0.2)

## 2018-08-26 LAB — I-STAT BETA HCG BLOOD, ED (MC, WL, AP ONLY): I-stat hCG, quantitative: <5 m[IU]/mL (ref <5)

## 2018-08-26 LAB — LIPASE, BLOOD: Lipase: 25 U/L (ref 11–51)

## 2018-08-26 MED ORDER — TRAMADOL HCL 50 MG PO TABS: 50.0000 mg | ORAL_TABLET | Freq: Four times a day (QID) | ORAL | 0 refills | Status: DC | PRN

## 2018-08-26 MED ORDER — IOHEXOL 300 MG/ML  SOLN
100.0000 mL | Freq: Once | INTRAMUSCULAR | Status: AC | PRN
  Administered 2018-08-26: 100 mL via INTRAVENOUS

## 2018-08-26 NOTE — Discharge Instructions (Addendum)
Your oxygen level was 100% on room air.  Which is within normal limits.  Your pregnancy test was negative.  Your complete blood count was nonacute.  Your comprehensive chemistry revealed a glucose of 248, otherwise within normal limits.  Your urine analysis revealed some evidence of dehydration.  You received IV fluids.  There is no evidence of a urinary tract infection or kidney stone.  A CT scan was obtained which reveals evidence of a viral gastroenteritis with some accompanying infected lymph nodes consistent with mesenteric adenitis.  Both of these seem to be related to a viral illness.  This is contagious.  Please use your mask until symptoms have resolved.  It is important that you increase fluids.  Please wash hands frequently.  Wipe down surfaces as needed.  Use Tylenol extra strength for fever or aching.  Use Ultram for more severe pain. This medication may cause drowsiness. Please do not drink, drive, or participate in activity that requires concentration while taking this medication.  Please see Dr. Gerda Diss for an office evaluation to ensure resolution of this issue.

## 2018-08-26 NOTE — ED Provider Notes (Signed)
[facility]GENCY [facility]PARTMENT Provider Note   CSN: [id number] Arrival date & time: 08/25/18  2216     History   Chief Complaint Chief Complaint  Patient presents with  . Abdominal Pain    HPI Joan Gonzalez is a 23 y.o. female.  Patient is a 23 year old female who presents to the emergency department with a complaint of pain under the right breast  and extending into the abdomen.  Patient states that this pain started on last evening.  Patient states that she is recently had as much as 9 episodes of diarrhea per day.  She is also had nausea.  There is been no fever, no blood in the diarrhea, no injury, no recent operations or procedure.  There is been no dysuria.  No recent history of kidney stones.  The patient states that she is unsure of the date of her last menstrual cycle.  She says she is on the Depakote shot.  The patient reports that she is a type I diabetic.  She is on an insulin pump.  The history is provided by the patient.    Past Medical History:  Diagnosis Date  . Diabetes mellitus without complication [facility](HCC)     Patient Active Problem List   Diagnosis Date Noted  . Type 1 diabetes mellitus (HCC) 11/16/2015    Past Surgical History:  Procedure Laterality Date  . dental implants    . TYMPANOSTOMY TUBE PLACEMENT       OB History   No obstetric history on file.      Home Medications    Prior to Admission medications   Medication Sig Start Date End Date Taking? Authorizing Provider  estradiol cypionate (DEPO-ESTRADIOL) 5 MG/ML injection Inject into the muscle.    [provider]  insulin aspart (NOVOLOG) 100 UNIT/ML injection Inject into the skin 3 (three) times daily before meals.    [provider]  Insulin Human (INSULIN PUMP) SOLN Inject into the skin.    [provider]  insulin lispro (HUMALOG) 100 UNIT/ML injection Inject into the skin 3 (three) times daily before meals.    [provider]  megestrol  (MEGACE) 40 MG tablet Take 3 tabs po qd x 5 d then 2 qd x 5 d then one qd Patient not taking: Reported on 05/06/2018 06/20/17   Campbell RichesHoskins, Carolyn C, NP    Family History Family History  Problem Relation Age of Onset  . Asthma Sister   . Seizures Sister   . Cancer Paternal Grandmother        breast  . COPD Paternal Grandfather     Social History Social History   Tobacco Use  . Smoking status: Never Smoker  . Smokeless tobacco: Never Used  Substance Use Topics  . Alcohol use: No    Alcohol/week: 0.0 standard drinks  . Drug use: No     Allergies   Patient has no known allergies.   Review of Systems Review of Systems  Constitutional: Negative for activity change.       All ROS Neg except as noted in HPI  HENT: Negative for nosebleeds.   Eyes: Negative for photophobia and discharge.  Respiratory: Negative for cough, shortness of breath and wheezing.   Cardiovascular: Negative for chest pain and palpitations.  Gastrointestinal: Positive for abdominal pain, diarrhea and nausea. Negative for blood in stool.  Genitourinary: Negative for dysuria, frequency and hematuria.  Musculoskeletal: Negative for arthralgias, back pain and neck pain.  Skin: Negative.   Neurological:  Negative for dizziness, seizures and speech difficulty.  Psychiatric/Behavioral: Negative for confusion and hallucinations.     Physical Exam Updated Vital Signs BP (!) 143/76 (BP Location: Right Arm)   Pulse (!) 101   Temp 97.9 F (36.6 C) (Oral)   Resp 20   Ht 5\' 2"  (1.575 m)   Wt 67.1 kg   SpO2 100%   BMI 27.07 kg/m   Physical Exam Vitals signs and nursing note reviewed.  Constitutional:      General: She is not in acute distress.    Appearance: She is well-developed. She is ill-appearing.  HENT:     Head: Normocephalic and atraumatic.     Right Ear: External ear normal.     Left Ear: External ear normal.  Eyes:     General: No scleral icterus.       Right eye: No discharge.         Left eye: No discharge.     Conjunctiva/sclera: Conjunctivae normal.  Neck:     Musculoskeletal: Neck supple.     Trachea: No tracheal deviation.  Cardiovascular:     Rate and Rhythm: Normal rate and regular rhythm.  Pulmonary:     Effort: Pulmonary effort is normal. No respiratory distress.     Breath sounds: Normal breath sounds. No stridor. No wheezing or rales.  Abdominal:     General: Bowel sounds are normal. There is no distension.     Palpations: Abdomen is soft.     Tenderness: There is abdominal tenderness. There is no guarding or rebound.     Comments: Mild right upper and lower abd pain.  Musculoskeletal:        General: No tenderness.  Skin:    General: Skin is warm.     Findings: No rash.  Neurological:     Mental Status: She is alert.     Cranial Nerves: No cranial nerve deficit (no facial droop, extraocular movements intact, no slurred speech).     Sensory: No sensory deficit.     Motor: No abnormal muscle tone or seizure activity.     Coordination: Coordination normal.      ED Treatments / Results  Labs (all labs ordered are listed, but only abnormal results are displayed) Labs Reviewed  COMPREHENSIVE METABOLIC PANEL - Abnormal; Notable for the following components:      Result Value   CO2 19 (*)    Glucose, Bld 248 (*)    All other components within normal limits  URINALYSIS, ROUTINE W REFLEX MICROSCOPIC - Abnormal; Notable for the following components:   APPearance HAZY (*)    Specific Gravity, Urine 1.033 (*)    Glucose, UA >=500 (*)    Ketones, ur 80 (*)    All other components within normal limits  CBG MONITORING, ED - Abnormal; Notable for the following components:   Glucose-Capillary 117 (*)    All other components within normal limits  LIPASE, BLOOD  CBC  I-STAT BETA HCG BLOOD, ED ([facility], [facility], [facility] ONLY)    EKG None  Radiology No results found.  Procedures Procedures (including critical care time)  Medications Ordered in ED Medications    sodium chloride 0.9 % bolus 1,000 mL (1,000 mLs Intravenous New Bag/Given 08/26/18 0021)    Followed by  0.9 %  sodium chloride infusion (has no administration in time range)  ondansetron (ZOFRAN) injection 4 mg (4 mg Intravenous Given 08/26/18 0023)  HYDROmorphone (DILAUDID) injection 0.5 mg (0.5 mg Intravenous Given 08/26/18 0023)  Initial Impression / Assessment and Plan / ED Course  I have reviewed the triage vital signs and the nursing notes.  Pertinent labs & imaging results that were available during my care of the patient were reviewed by me and considered in my medical decision making (see chart for details).       Final Clinical Impressions(s) / ED Diagnoses MDM  Pulse rate is slightly elevated at 101.  Blood pressure is elevated at 143/76, otherwise vital signs within normal limits.  Pulse oximetry is 100% on room air.  Within normal limits by my interpretation.  Patient presents with diffuse mild right upper and lower abdomen pain.  Beta-hCG is less than 5.  Doubt ectopic pregnancy.  Urine analysis shows a hazy specimen with a specific gravity of 1.033, and a glucose greater than 500 mg/daL.  The remainder of the urine study is within normal limits..  No evidence for urinary tract infection or kidney stone.  The comprehensive metabolic panel shows the glucose to be elevated at 248.  The CO2 is slightly low at 19.  The anion gap is 10.  The remainder of the comprehensive metabolic panel is within normal limits. The complete blood count is well within normal limits.  CT scan of the abdomen shows a mild fluid-filled distention in the distal small bowel loops, consistent with small bowel enteritis.  There are numerous small right lower quadrant and pelvic mesenteric lymph nodes present, consistent with adenitis.  I discussed these findings with the patient in terms which he understands.  Patient is given a prescription for Ultram to use in the event the pain should return.  I  have asked the patient to increase fluids, and to wash hands frequently.  The patient will use Tylenol extra strength for any discomfort or temperature change.  The patient is to see the primary physician or return to the emergency department if any changes in condition, problems, or concerns.   Final diagnoses:  Gastroenteritis  Mesenteric adenitis    ED Discharge Orders         Ordered    traMADol (ULTRAM) 50 MG tablet  Every 6 hours PRN     08/26/18 0147           Ivery Quale, PA-C 08/26/18 0223    Mesner, Barbara Cower, MD 08/28/18 2321

## 2018-08-28 ENCOUNTER — Ambulatory Visit (INDEPENDENT_AMBULATORY_CARE_PROVIDER_SITE_OTHER): Payer: PRIVATE HEALTH INSURANCE | Admitting: Family Medicine

## 2018-08-28 ENCOUNTER — Encounter: Payer: Self-pay | Admitting: Family Medicine

## 2018-08-28 VITALS — BP 112/76 | Temp 98.4°F | Wt 149.4 lb

## 2018-08-28 DIAGNOSIS — E109 Type 1 diabetes mellitus without complications: Secondary | ICD-10-CM

## 2018-08-28 DIAGNOSIS — R197 Diarrhea, unspecified: Secondary | ICD-10-CM

## 2018-08-28 DIAGNOSIS — A084 Viral intestinal infection, unspecified: Secondary | ICD-10-CM

## 2018-08-28 LAB — POCT GLYCOSYLATED HEMOGLOBIN (HGB A1C): Hemoglobin A1C: 6.4 % — AB (ref 4.0–5.6)

## 2018-08-28 NOTE — Progress Notes (Signed)
   Subjective:    Patient ID: Joan Gonzalez, female    DOB: 19-Oct-1995, 23 y.o.   MRN: [id number]  HPI Pt here today for follow up on stomach bug. Pt went to ED on Sunday night. Pt states she is better somewhat but is still having trouble eating. Pt has no appetite. Pt states she is still having pain in RUQ; not constant. Sometimes radiates to middle of stomach. Pt has tried Tylenol.  Patient has significant trouble over the weekend end up having to go to ER had some right upper quadrant abdominal pain they did a CAT scan did not show any growths or tumors she states she still having some discomfort but not as severe it seems to be fading.  Did have quite a bit of diarrhea over the weekend but not much now no blood or mucus in the stools  Review of Systems  Constitutional: Negative for activity change and appetite change.  HENT: Negative for congestion and rhinorrhea.   Respiratory: Negative for cough and shortness of breath.   Cardiovascular: Negative for chest pain and leg swelling.  Gastrointestinal: Positive for abdominal pain and nausea. Negative for vomiting.  Skin: Negative for color change.  Neurological: Negative for dizziness and weakness.  Psychiatric/Behavioral: Negative for agitation and confusion.       Objective:   Physical Exam Vitals signs reviewed.  Constitutional:      General: She is not in acute distress. HENT:     Head: Normocephalic.  Cardiovascular:     Rate and Rhythm: Normal rate and regular rhythm.     Heart sounds: Normal heart sounds. No murmur.  Pulmonary:     Effort: Pulmonary effort is normal.     Breath sounds: Normal breath sounds.  Lymphadenopathy:     Cervical: No cervical adenopathy.  Neurological:     Mental Status: She is alert.  Psychiatric:        Behavior: Behavior normal.     Abdomen overall no guarding rebound or tenderness      Assessment & Plan:  Type 1 diabetes A1c overall looks good I encourage patient to get back in  with her endocrinologist on a regular basis  Intermittent abdominal pain with diarrhea overall patient is doing better patient will let us know if ongoing troubles or worse no need for any type of intervention

## 2018-11-06 ENCOUNTER — Ambulatory Visit (INDEPENDENT_AMBULATORY_CARE_PROVIDER_SITE_OTHER): Payer: PRIVATE HEALTH INSURANCE | Admitting: *Deleted

## 2018-11-06 ENCOUNTER — Other Ambulatory Visit: Payer: Self-pay

## 2018-11-06 DIAGNOSIS — Z3042 Encounter for surveillance of injectable contraceptive: Secondary | ICD-10-CM | POA: Diagnosis not present

## 2018-11-06 DIAGNOSIS — Z3009 Encounter for other general counseling and advice on contraception: Secondary | ICD-10-CM

## 2018-11-06 MED ORDER — MEDROXYPROGESTERONE ACETATE 150 MG/ML IM SUSP
150.0000 mg | Freq: Once | INTRAMUSCULAR | Status: AC
  Administered 2018-11-06: 150 mg via INTRAMUSCULAR

## 2019-01-03 ENCOUNTER — Other Ambulatory Visit: Payer: PRIVATE HEALTH INSURANCE

## 2019-01-03 ENCOUNTER — Other Ambulatory Visit: Payer: Self-pay | Admitting: Internal Medicine

## 2019-01-03 DIAGNOSIS — Z20822 Contact with and (suspected) exposure to covid-19: Secondary | ICD-10-CM

## 2019-01-09 LAB — NOVEL CORONAVIRUS, NAA: SARS-CoV-2, NAA: NOT DETECTED

## 2019-02-04 ENCOUNTER — Other Ambulatory Visit: Payer: Self-pay

## 2019-02-04 ENCOUNTER — Ambulatory Visit (INDEPENDENT_AMBULATORY_CARE_PROVIDER_SITE_OTHER): Payer: PRIVATE HEALTH INSURANCE | Admitting: *Deleted

## 2019-02-04 DIAGNOSIS — Z3042 Encounter for surveillance of injectable contraceptive: Secondary | ICD-10-CM

## 2019-02-04 DIAGNOSIS — Z3009 Encounter for other general counseling and advice on contraception: Secondary | ICD-10-CM

## 2019-02-04 MED ORDER — MEDROXYPROGESTERONE ACETATE 150 MG/ML IM SUSP
150.0000 mg | Freq: Once | INTRAMUSCULAR | Status: AC
  Administered 2019-02-04: 150 mg via INTRAMUSCULAR

## 2019-02-28 ENCOUNTER — Other Ambulatory Visit: Payer: Self-pay

## 2019-02-28 ENCOUNTER — Encounter: Payer: Self-pay | Admitting: Nurse Practitioner

## 2019-02-28 ENCOUNTER — Ambulatory Visit (INDEPENDENT_AMBULATORY_CARE_PROVIDER_SITE_OTHER): Payer: PRIVATE HEALTH INSURANCE | Admitting: Nurse Practitioner

## 2019-02-28 VITALS — BP 116/72 | Temp 97.4°F | Ht 62.0 in | Wt 162.0 lb

## 2019-02-28 DIAGNOSIS — Z308 Encounter for other contraceptive management: Secondary | ICD-10-CM | POA: Diagnosis not present

## 2019-02-28 DIAGNOSIS — R635 Abnormal weight gain: Secondary | ICD-10-CM

## 2019-02-28 DIAGNOSIS — Z111 Encounter for screening for respiratory tuberculosis: Secondary | ICD-10-CM

## 2019-02-28 DIAGNOSIS — Z3042 Encounter for surveillance of injectable contraceptive: Secondary | ICD-10-CM

## 2019-02-28 NOTE — Progress Notes (Signed)
   Subjective:    Patient ID: Joan Gonzalez, female    DOB: [birth year], 23 y.o.   MRN: [id number]  HPI pt needs staff health assessment/medical report form filled out and tb test. Last wellness was 05/06/18. Pt has reported increase weight gain with Depo-Pravero. Patient was on oral contraceptive, however had several missed doses. No other specific concern. Type 1 DM well-managed per patient. Considering conception in the next few years.    Review of Systems  Respiratory: Negative for chest tightness and shortness of breath.   Cardiovascular: Negative for chest pain.  Genitourinary: Negative for menstrual problem and pelvic pain.       Objective:   Physical Exam Constitutional:      Appearance: Normal appearance.  Cardiovascular:     Rate and Rhythm: Normal rate and regular rhythm.     Pulses: Normal pulses.     Heart sounds: Normal heart sounds.  Pulmonary:     Effort: Pulmonary effort is normal.     Breath sounds: Normal breath sounds.  Neurological:     Mental Status: She is alert.  Psychiatric:        Mood and Affect: Mood normal.        Behavior: Behavior normal.           Assessment & Plan:  1. Screening for tuberculosis - TB Skin Test  2. Surveillance for Depo-Provera contraception   Educated patient about Nuvaring, as patient is currently gaining weight on Depo shot. Since patient plans possible conception within the next couple of years, not a good candidate for other contraceptives such as Nexplanon or IUD. Advised patient to receive annual influenza vaccine when available. Continue follow up with endocrinologist. Paperwork filled out for her employer and given a copy of her last physical report.  Follow up at next PE. Call back if she wishes to try Nuvaring. Given info on her AVS.    Return in about 3 months (around 05/31/2019) for physical.

## 2019-02-28 NOTE — Patient Instructions (Signed)
Ethinyl Estradiol; Etonogestrel vaginal ring What is this medicine? ETHINYL ESTRADIOL; ETONOGESTREL (ETH in il es tra DYE ole; et oh noe JES trel) vaginal ring is a flexible, vaginal ring used as a contraceptive (birth control method). This medicine combines 2 types of female hormones, an estrogen and a progestin. This ring is used to prevent ovulation and pregnancy. Each ring is effective for 1 month. This medicine may be used for other purposes; ask your health care provider or pharmacist if you have questions. COMMON BRAND NAME(S): EluRyng, NuvaRing What should I tell my health care provider before I take this medicine? They need to know if you have any of these conditions:  abnormal vaginal bleeding  blood vessel disease or blood clots  breast, cervical, endometrial, ovarian, liver, or uterine cancer  diabetes  gallbladder disease  having surgery  heart disease or recent heart attack  high blood pressure  high cholesterol or triglycerides  history of irregular heartbeat or heart valve problems  kidney disease  liver disease  migraine headaches  protein C deficiency  protein S deficiency  recently had a baby, miscarriage, or abortion  stroke  systemic lupus erythematosus (SLE)  tobacco smoker  your age is more than 24 years old  an unusual or allergic reaction to estrogens, progestins, other medicines, foods, dyes, or preservatives  pregnant or trying to get pregnant  breast-feeding How should I use this medicine? Insert the ring into your vagina as directed. Follow the directions on the prescription label. The ring will remain place for 3 weeks and is then removed for a 1-week break. A new ring is inserted 1 week after the last ring was removed, on the same day of the week. Check often to make sure the ring is still in place. If the ring was out of the vagina for an unknown amount of time, you may not be protected from pregnancy. Perform a pregnancy test and  call your doctor. Do not use more often than directed. A patient package insert for the product will be given with each prescription and refill. Read this sheet carefully each time. The sheet may change frequently. Contact your pediatrician regarding the use of this medicine in children. Special care may be needed. Overdosage: If you think you have taken too much of this medicine contact a poison control center or emergency room at once. NOTE: This medicine is only for you. Do not share this medicine with others. What if I miss a dose? You will need to use the ring exactly as directed. It is very important to follow the schedule every cycle. If you do not use the ring as directed, you may not be protected from pregnancy. If the ring should slip out, is lost, or if you leave it in longer or shorter than you should, contact your health care professional for advice. What may interact with this medicine? Do not take this medicine with the following medications:  dasabuvir; ombitasvir; paritaprevir; ritonavir  ombitasvir; paritaprevir; ritonavir  vaginal lubricants or other vaginal products that are oil-based or silicone-based This medicine may also interact with the following medications:  acetaminophen  antibiotics or medicines for infections, especially rifampin, rifabutin, rifapentine, and griseofulvin, and possibly penicillins or tetracyclines  aprepitant or fosaprepitant  armodafinil  ascorbic acid (vitamin C)  barbiturate medicines, such as phenobarbital or primidone  bosentan  certain antiviral medicines for hepatitis, HIV or AIDS  certain medicines for cancer treatment  certain medicines for seizures like carbamazepine, clobazam, felbamate, lamotrigine, oxcarbazepine, phenytoin,   rufinamide, topiramate  certain medicines for treating high cholesterol  cyclosporine  dantrolene  elagolix  flibanserin  grapefruit juice  lesinurad  medicines for diabetes  medicines  to treat fungal infections, such as griseofulvin, miconazole, fluconazole, ketoconazole, itraconazole, posaconazole or voriconazole  mifepristone  mitotane  modafinil  morphine  mycophenolate  St. John's wort  tamoxifen  temazepam  theophylline or aminophylline  thyroid hormones  tizanidine  tranexamic acid  ulipristal  warfarin This list may not describe all possible interactions. Give your health care provider a list of all the medicines, herbs, non-prescription drugs, or dietary supplements you use. Also tell them if you smoke, drink alcohol, or use illegal drugs. Some items may interact with your medicine. What should I watch for while using this medicine? Visit your doctor or health care professional for regular checks on your progress. You will need a regular breast and pelvic exam and Pap smear while on this medicine. Check with your doctor or health care professional to see if you need an additional method of contraception during the first cycle that you use this ring. Female condoms (made with natural rubber latex, polyisoprene, and polyurethane) and spermicides may be used. Do not use a diaphragm, cervical cap, or a female condom, as the ring can interfere with these birth control methods and their proper placement. If you have any reason to think you are pregnant, stop using this medicine right away and contact your doctor or health care professional. If you are using this medicine for hormone related problems, it may take several cycles of use to see improvement in your condition. Smoking increases the risk of getting a blood clot or having a stroke while you are using hormonal birth control, especially if you are more than 24 years old. You are strongly advised not to smoke. Some women are prone to getting dark patches on the skin of the face (cholasma). Your risk of getting chloasma with this medicine is higher if you had chloasma during a pregnancy. Keep out of the  sun. If you cannot avoid being in the sun, wear protective clothing and use sunscreen. Do not use sun lamps or tanning beds/booths. This medicine can make your body retain fluid, making your fingers, hands, or ankles swell. Your blood pressure can go up. Contact your doctor or health care professional if you feel you are retaining fluid. If you are going to have elective surgery, you may need to stop using this medicine before the surgery. Consult your health care professional for advice. This medicine does not protect you against HIV infection (AIDS) or any other sexually transmitted diseases. What side effects may I notice from receiving this medicine? Side effects that you should report to your doctor or health care professional as soon as possible:  allergic reactions such as skin rash or itching, hives, swelling of the lips, mouth, tongue, or throat  depression  high blood pressure  migraines or severe, sudden headaches  signs and symptoms of a blood clot such as breathing problems; changes in vision; chest pain; severe, sudden headache; pain, swelling, warmth in the leg; trouble speaking; sudden numbness or weakness of the face, arm or leg  signs and symptoms of infection like fever or chills with dizziness and a sunburn-like rash, or pain or trouble passing urine  stomach pain  symptoms of vaginal infection like itching, irritation or unusual discharge  yellowing of the eyes or skin Side effects that usually do not require medical attention (report these to your doctor   or health care professional if they continue or are bothersome):  acne  breast pain, tenderness  irregular vaginal bleeding or spotting, particularly during the first month of use  mild headache  nausea  painful periods  vomiting This list may not describe all possible side effects. Call your doctor for medical advice about side effects. You may report side effects to FDA at 1-800-FDA-1088. Where should I  keep my medicine? Keep out of the reach of children. Store unopened rings in the original foil pouch at room temperature between 20 and 25 degrees C (68 and 77 degrees F) for up to 4 months. Protect from light. Do not store above 30 degrees C (86 degrees F). Throw away any unused medicine after the expiration date. A ring may only be used for 1 cycle (1 month). After the 3-week cycle, a used ring is removed and should be placed in the re-closable foil pouch and discarded in the trash out of reach of children and pets. Do NOT flush down the toilet. NOTE: This sheet is a summary. It may not cover all possible information. If you have questions about this medicine, talk to your doctor, pharmacist, or health care provider.  2020 Elsevier/Gold Standard (2017-02-23 14:41:10)  

## 2019-03-01 ENCOUNTER — Encounter: Payer: Self-pay | Admitting: Nurse Practitioner

## 2019-03-03 ENCOUNTER — Other Ambulatory Visit: Payer: Self-pay | Admitting: *Deleted

## 2019-03-03 LAB — TB SKIN TEST
Induration: 0 mm
TB Skin Test: NEGATIVE

## 2019-04-12 ENCOUNTER — Other Ambulatory Visit (INDEPENDENT_AMBULATORY_CARE_PROVIDER_SITE_OTHER): Payer: PRIVATE HEALTH INSURANCE | Admitting: *Deleted

## 2019-04-12 DIAGNOSIS — Z23 Encounter for immunization: Secondary | ICD-10-CM

## 2019-05-12 ENCOUNTER — Telehealth: Payer: Self-pay | Admitting: Family Medicine

## 2019-05-12 ENCOUNTER — Encounter: Payer: Self-pay | Admitting: Family Medicine

## 2019-05-12 NOTE — Telephone Encounter (Signed)
Patient was in direct contact with a friend on 10/26 and she got tested for Covid on 10/27 and was positive on 10/30 and her job requires her to be out of work 14 days for quarantine and she is needing a work note. She is going to be tested also.She has no symptoms right now. Please advise [phone]

## 2019-05-12 NOTE — Telephone Encounter (Signed)
Patient  Work notei upfront.

## 2019-05-12 NOTE — Telephone Encounter (Signed)
Called and discussed with pt. Please send her note to her mychart and pt is already aware it will be sent. See below for note and add quarantine started on 10/30.

## 2019-05-12 NOTE — Telephone Encounter (Signed)
The patient may have a work note stating that she is under self quarantine for 14 days due to Covid exposure  Please also make sure patient lets [place] know the results of her tests  Please give her the work note you may send it to her via [place] along with a phone call that this has been completed

## 2019-05-13 ENCOUNTER — Other Ambulatory Visit: Payer: Self-pay | Admitting: *Deleted

## 2019-05-13 DIAGNOSIS — Z20822 Contact with and (suspected) exposure to covid-19: Secondary | ICD-10-CM

## 2019-05-15 LAB — NOVEL CORONAVIRUS, NAA: SARS-CoV-2, NAA: NOT DETECTED

## 2019-05-26 ENCOUNTER — Telehealth: Payer: Self-pay | Admitting: Nutrition

## 2019-05-26 NOTE — Telephone Encounter (Signed)
Message left on my machine that she has a referral from Dr. Buddy Duty to see me to learn about the [id number] insulin pump.  Called her back and left message of times tomorrow for appointment or on Wednesday.  To  Call me back.

## 2019-05-28 ENCOUNTER — Other Ambulatory Visit: Payer: Self-pay

## 2019-05-28 ENCOUNTER — Encounter: Payer: PRIVATE HEALTH INSURANCE | Attending: Internal Medicine | Admitting: Nutrition

## 2019-05-28 DIAGNOSIS — E1065 Type 1 diabetes mellitus with hyperglycemia: Secondary | ICD-10-CM | POA: Insufficient documentation

## 2019-06-11 NOTE — Progress Notes (Signed)
We discussed how pumps work, and she was shown the 3 different models.  We discussed the advantages of each models.  She was given brochures of each model and she was told to go to their respective web sites and review each model.  We also discussed cgms and how they work and which pumps use which CGM.  She had no final questions

## 2019-06-26 IMAGING — CT CT ABD-PELV W/ CM
2 of 4 series · 16 of 46 positions shown, 18 images · IV contrast (omnipaque)
Comparison: None.

CLINICAL DATA: Sharp intermittent right upper quadrant abdominal
pain.

EXAM:
CT ABDOMEN AND PELVIS WITH CONTRAST
TECHNIQUE: Multidetector CT imaging of the abdomen and pelvis was performed
using the standard protocol following bolus administration of
intravenous contrast.
CONTRAST:  100mL OMNIPAQUE IOHEXOL 300 MG/ML  SOLN

[Series 2: axial st · axial · 0.65mm/px · z∈[-671,-286]mm · 13 of 85 slices shown, 15 images]
[im 4/85  soft-tissue]
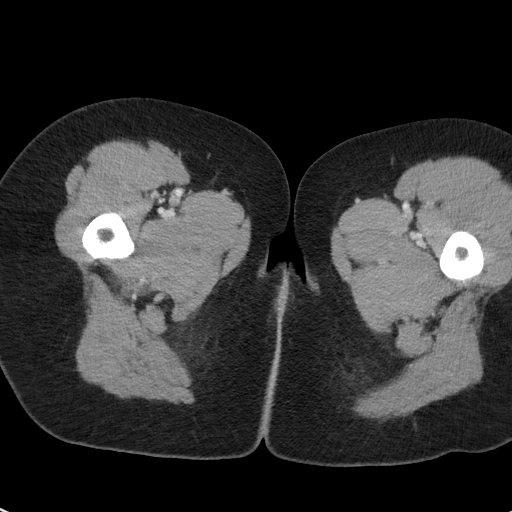
[im 4/85  bone]
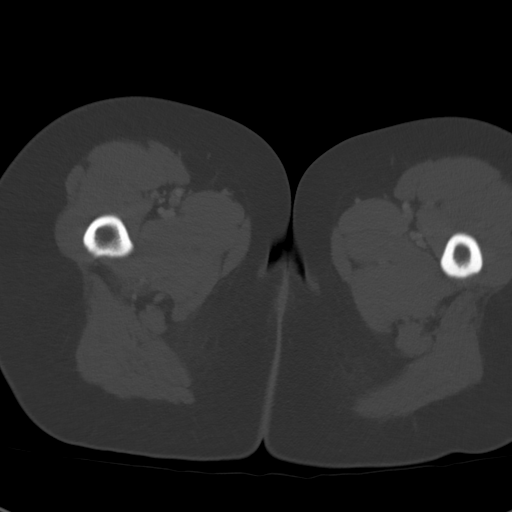
[im 11/85  soft-tissue]
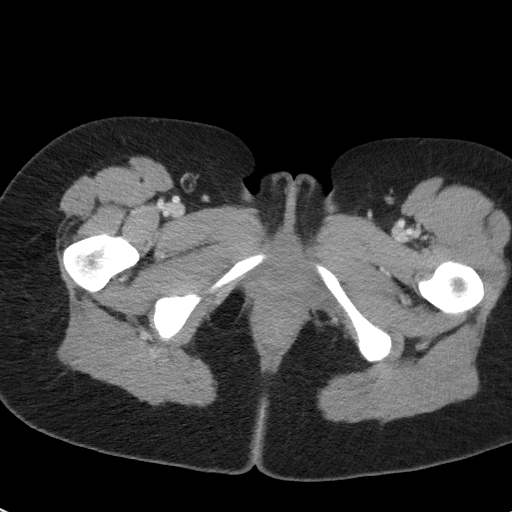
[im 18/85  soft-tissue]
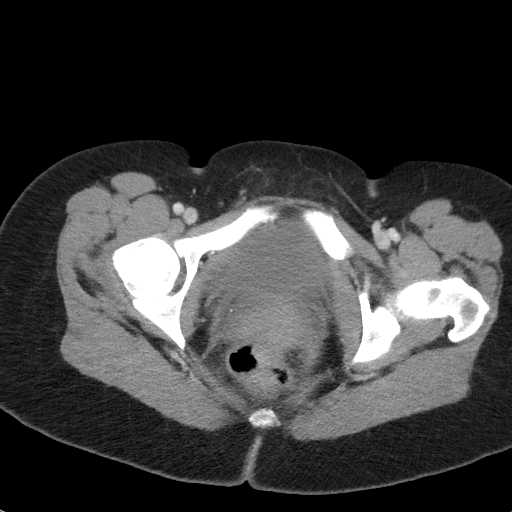
[im 25/85  soft-tissue]
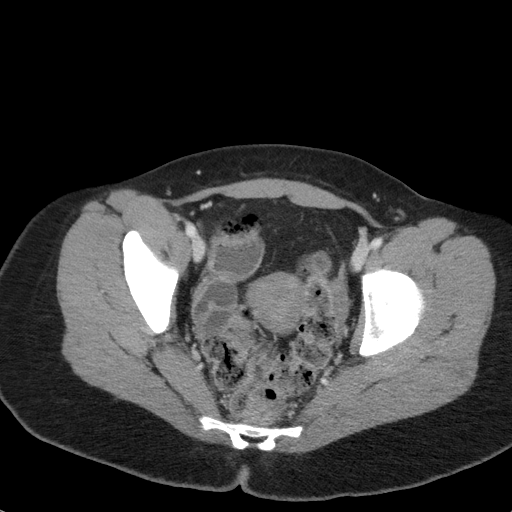
[im 29/85  soft-tissue]
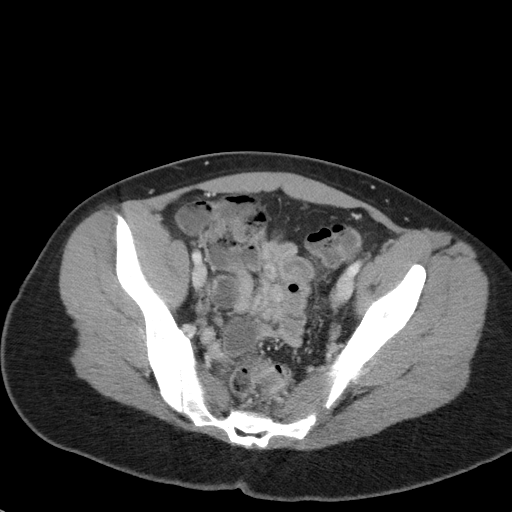
[im 36/85  soft-tissue]
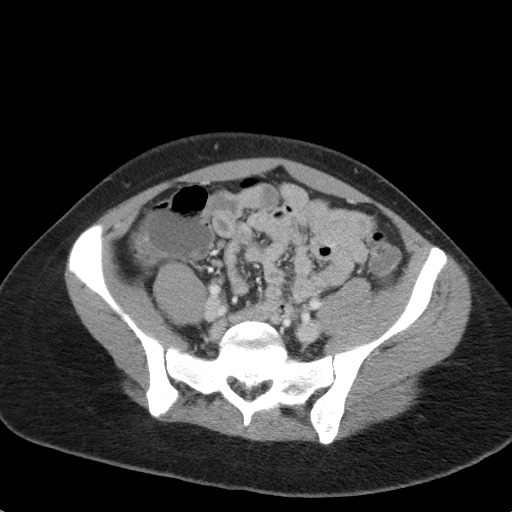
[im 43/85  soft-tissue]
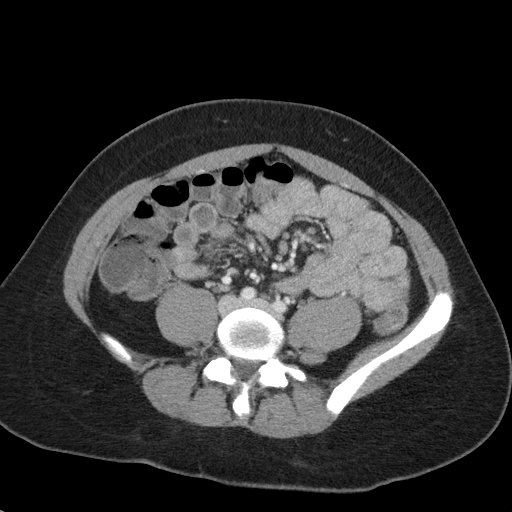
[im 50/85  soft-tissue]
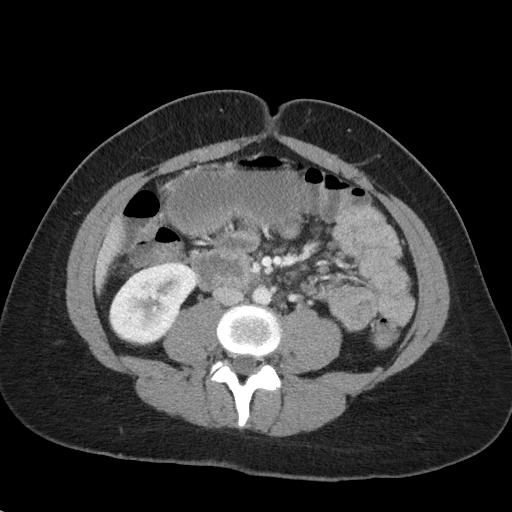
[im 57/85  soft-tissue]
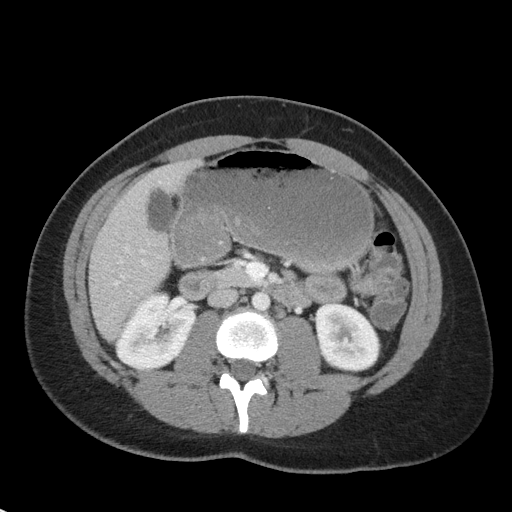
[im 57/85  bone]
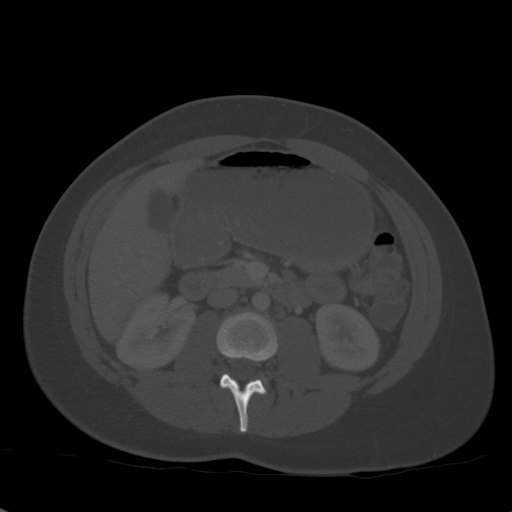
[im 60/85  soft-tissue]
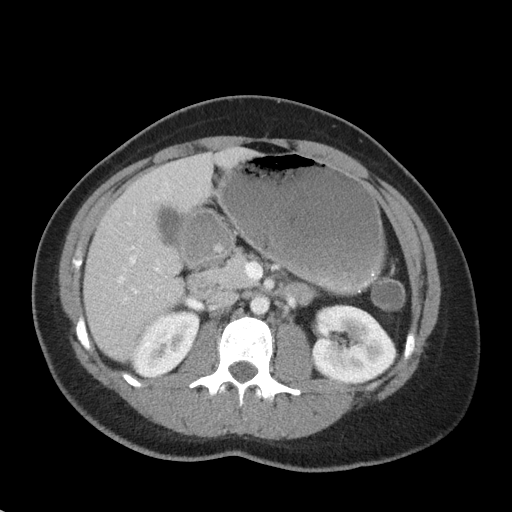
[im 67/85  soft-tissue]
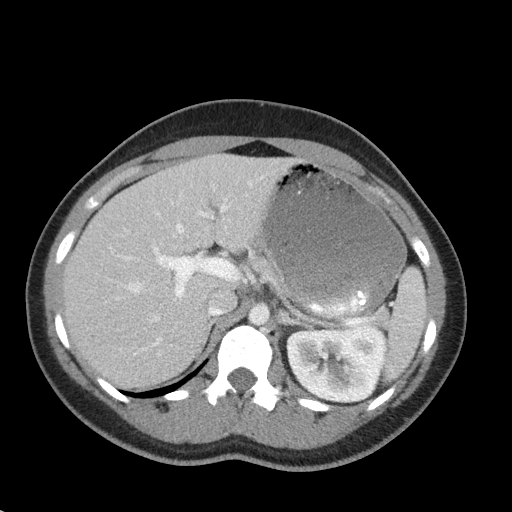
[im 74/85  soft-tissue]
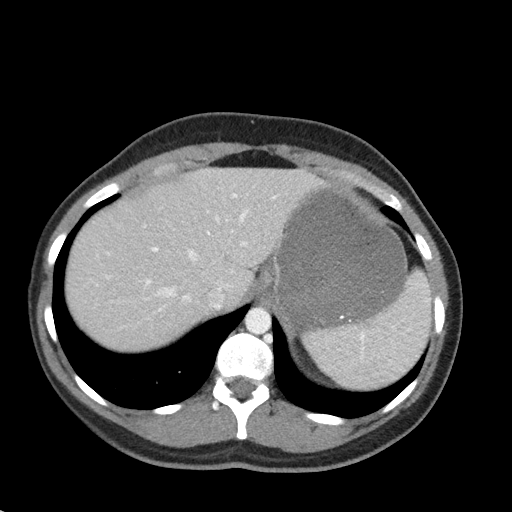
[im 81/85  soft-tissue]
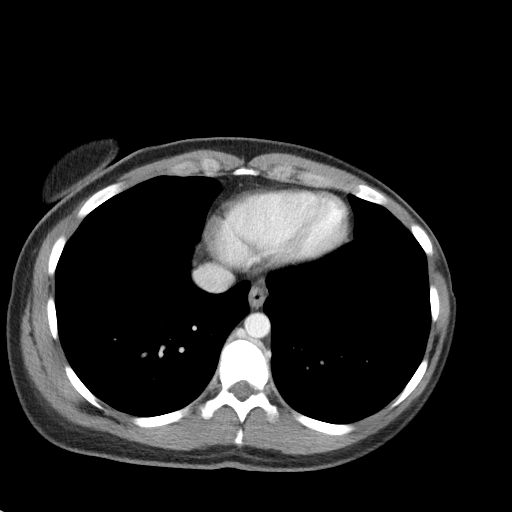

[Series 5: coronal st · coronal · 0.74mm/px · 3 of 98 slices shown]
[im 33/98  soft-tissue]
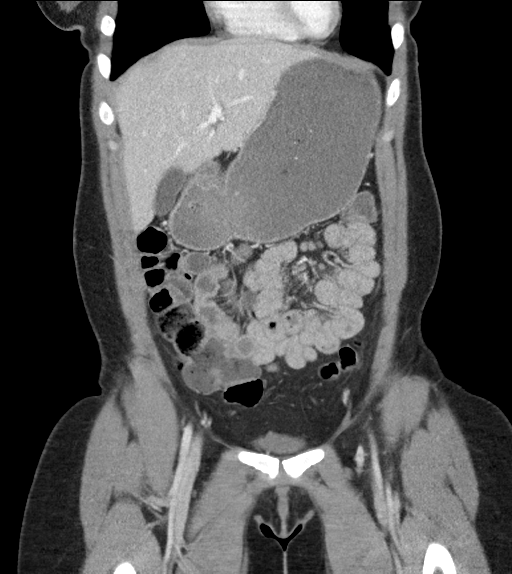
[im 44/98  soft-tissue]
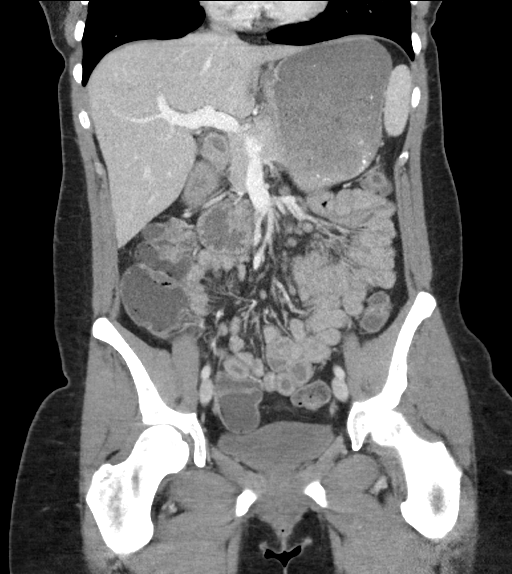
[im 54/98  soft-tissue]
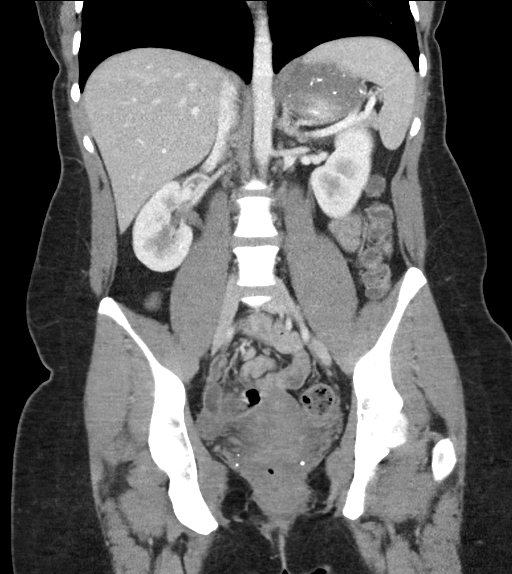

[16 of 46 positions shown; findings below may reference images not displayed]

FINDINGS: Lower chest: No acute abnormality.

Hepatobiliary: No focal liver abnormality is seen. No gallstones,
gallbladder wall thickening, or biliary dilatation.

Pancreas: Unremarkable. No pancreatic ductal dilatation or
surrounding inflammatory changes.

Spleen: Normal in size without focal abnormality.

Adrenals/Urinary Tract: Normal bilateral adrenal glands, kidneys and
ureters. No obstructive uropathy. The urinary bladder is
unremarkable.

Stomach/Bowel: Moderate fluid-filled distention of the stomach with
ingested contrast and food. Duodenal sweep and ligament of Treitz
are normal. Few distal small bowel loops demonstrate fluid-filled
distention and slight mucosal enhancement. Enteritis might account
for this appearance.

Vascular/Lymphatic: No significant vascular findings are present.
Numerous small right lower quadrant and pelvic mesenteric lymph
nodes are identified possibly representing adenitis.

Reproductive: Uterus and bilateral adnexa are unremarkable.

Other: Small periumbilical fat containing hernia.

Musculoskeletal: Unremarkable
IMPRESSION: 1. Mild fluid-filled distention of distal small bowel loops with
slight mucosal enhancement. Findings raise possibility of small
bowel enteritis.
2. Numerous small right lower quadrant and pelvic mesenteric lymph
nodes possibly representing adenitis.

## 2019-07-30 ENCOUNTER — Other Ambulatory Visit: Payer: Self-pay

## 2019-07-30 ENCOUNTER — Encounter: Payer: PRIVATE HEALTH INSURANCE | Attending: Internal Medicine | Admitting: Nutrition

## 2019-07-30 DIAGNOSIS — E1065 Type 1 diabetes mellitus with hyperglycemia: Secondary | ICD-10-CM

## 2019-07-31 NOTE — Progress Notes (Signed)
Joan Gonzalez was trained on how to use the Tandem Control IQ insulin pump.  Settings were transferred from her old pump:  Basal rate: 1.4u/hr, I/C: MN:6, 4PM: 7, 10PM: 6     ISF: 35, Timing: 5 hours and target 110 (per Control IQ settings).  She was wearing the Dexcom G6 and has the app already instralled on her phone for viewing and sending readings to the cloud.  The Tandem pump was linked to this, and she was show how to bolus, and re demonstrated this accurately.  She filled a cartridge with Humalog insulin, and attached a 6 mm     Infusion set into her left abdomen.  We reviewed the need to rotate infusion set locations and she was shown different sites to use for this. She was given handouts on directions for: How to make setting changes, Start/stoping the pump, View the status screen, giving a bolus/extended bolus, activity/sleep mode, and understanding icons on the control IQ pump.  She was strongly encouraged to review these tonight, and she agreed to do this. She signed checklist as understanding all topics covered and had no final questions. Her T connect account settings to link to Tandem: log in: [email or web address]  [email or web address]

## 2019-07-31 NOTE — Patient Instructions (Signed)
Read over handouts given as well as the owner's manual Call if questions.

## 2019-08-04 ENCOUNTER — Telehealth: Payer: Self-pay | Admitting: Nutrition

## 2019-08-04 NOTE — Telephone Encounter (Signed)
Joan Gonzalez reported no difficulty using the pump.  No alerts/alarms that she could not correct.  Reports the control IQ is working, and that it gave extra insulin, as well as stopped the flow when blood sugar was approaching 70.  She had no questions or problems.  Suggested she see me one more time to review again in about a week.  She will call me if she feels she needs to come in.

## 2019-09-02 ENCOUNTER — Other Ambulatory Visit: Payer: Self-pay

## 2019-09-02 ENCOUNTER — Ambulatory Visit: Payer: PRIVATE HEALTH INSURANCE | Attending: Internal Medicine

## 2019-09-02 DIAGNOSIS — Z20822 Contact with and (suspected) exposure to covid-19: Secondary | ICD-10-CM

## 2019-09-03 LAB — NOVEL CORONAVIRUS, NAA: SARS-CoV-2, NAA: NOT DETECTED

## 2019-09-05 ENCOUNTER — Ambulatory Visit: Payer: PRIVATE HEALTH INSURANCE | Attending: Internal Medicine

## 2019-09-05 ENCOUNTER — Other Ambulatory Visit: Payer: Self-pay

## 2019-09-05 DIAGNOSIS — Z20822 Contact with and (suspected) exposure to covid-19: Secondary | ICD-10-CM

## 2019-09-06 LAB — NOVEL CORONAVIRUS, NAA: SARS-CoV-2, NAA: NOT DETECTED

## 2019-09-14 ENCOUNTER — Ambulatory Visit: Payer: PRIVATE HEALTH INSURANCE | Attending: Internal Medicine

## 2019-09-14 DIAGNOSIS — Z23 Encounter for immunization: Secondary | ICD-10-CM

## 2019-09-14 NOTE — Progress Notes (Signed)
   Covid-19 Vaccination Clinic  Name:  Joan Gonzalez    MRN: [id number] DOB: May 19, 1996  09/14/2019  [name] was observed post Covid-19 immunization for 30 minutes based on pre-vaccination screening and had chills after administration. EMS evaluated her and cleared her to go.  She was provided with Vaccine Information Sheet and instruction to access the V-Safe system.   [name] was instructed to call 911 with any severe reactions post vaccine: Marland Kitchen Difficulty breathing  . Swelling of face and throat  . A fast heartbeat  . A bad rash all over body  . Dizziness and weakness   Immunizations Administered    Name Date Dose VIS Date Route   Pfizer COVID-19 Vaccine 09/14/2019 12:39 PM 0.3 mL 06/20/2019 Intramuscular   Manufacturer: [employer], [employer]   Lot: [id number]   NDC: [address]-2

## 2019-09-30 ENCOUNTER — Other Ambulatory Visit: Payer: Self-pay

## 2019-09-30 ENCOUNTER — Encounter: Payer: Self-pay | Admitting: Adult Health

## 2019-09-30 ENCOUNTER — Ambulatory Visit (INDEPENDENT_AMBULATORY_CARE_PROVIDER_SITE_OTHER): Payer: PRIVATE HEALTH INSURANCE | Admitting: Adult Health

## 2019-09-30 VITALS — BP 135/76 | HR 95 | Ht 62.5 in | Wt 157.0 lb

## 2019-09-30 DIAGNOSIS — F32A Depression, unspecified: Secondary | ICD-10-CM | POA: Insufficient documentation

## 2019-09-30 DIAGNOSIS — F419 Anxiety disorder, unspecified: Secondary | ICD-10-CM

## 2019-09-30 DIAGNOSIS — Z319 Encounter for procreative management, unspecified: Secondary | ICD-10-CM

## 2019-09-30 DIAGNOSIS — Z794 Long term (current) use of insulin: Secondary | ICD-10-CM | POA: Diagnosis not present

## 2019-09-30 DIAGNOSIS — E109 Type 1 diabetes mellitus without complications: Secondary | ICD-10-CM

## 2019-09-30 HISTORY — DX: Encounter for procreative management, unspecified: Z31.9

## 2019-09-30 MED ORDER — HYDROXYZINE HCL 10 MG PO TABS: 10.0000 mg | ORAL_TABLET | Freq: Three times a day (TID) | ORAL | 0 refills | Status: DC | PRN

## 2019-09-30 NOTE — Progress Notes (Signed)
  Subjective:     Patient ID: Joan Gonzalez, female   DOB: 1996/03/12, 24 y.o.   MRN: [id number]  HPI Joan Gonzalez is a 24 year old white female,married since 2017, in to talk about getting pregnant.She has been diabetic since 2001, is on the pump now and sees Dr Sharl Ma. PCP is [facility].  Review of Systems Has been off depo for about 6 months,periods regular now A1c 6.8,has pump Has dental implant that is loose and she is upset Reviewed past medical,surgical, social and family history. Reviewed medications and allergies.     Objective:   Physical Exam BP 135/76 (BP Location: Left Arm, Patient Position: Sitting, Cuff Size: Normal)   Pulse 95   Ht 5' 2.5" (1.588 m)   Wt 157 lb (71.2 kg)   LMP 09/19/2019   BMI 28.26 kg/m  Skin warm and dry. Neck: mid line trachea, normal thyroid, good ROM, no lymphadenopathy noted. Lungs: clear to ausculation bilaterally. Cardiovascular: regular rate and rhythm. Fall risk is low PHQ 9 score is  8, denies any SI, but is anxious and sad, will try Vistaril  Dr Despina Hidden in to speak with pt about DM and pregnancy.     Assessment:     1. Patient desires pregnancy Check progesterone level 10/09/19 Get PNV with 800 mcg folic acid  Have sex every other day 7-24 of cycle and pee before and lay there after  Face time 30 minutes with 50% counseling   2. Anxiety Will rx vistaril  Meds ordered this encounter  Medications  . hydrOXYzine (ATARAX/VISTARIL) 10 MG tablet    Sig: Take 1 tablet (10 mg total) by mouth 3 (three) times daily as needed.    Dispense:  30 tablet    Refill:  0    Order Specific Question:   Supervising Provider    Answer:   Despina Hidden, LUTHER H [[id number]]    3. Type 1 diabetes mellitus without complication (HCC) Watch GI, do not chase diet with insulin Stay active      Plan:     Return in 6 weeks for pap and physical

## 2019-10-05 ENCOUNTER — Ambulatory Visit: Payer: PRIVATE HEALTH INSURANCE | Attending: Internal Medicine

## 2019-10-05 DIAGNOSIS — Z23 Encounter for immunization: Secondary | ICD-10-CM

## 2019-10-05 NOTE — Progress Notes (Signed)
   Covid-19 Vaccination Clinic  Name:  Joan Gonzalez    MRN: [id number] DOB: [birth year]  10/05/2019  Joan Gonzalez was observed post Covid-19 immunization for 30 minutes .  During the observation period, she experienced an adverse reaction with the following symptoms:  Chills .  Assessment : Time of assessment. Alert and oriented.  Actions taken:  Vitals sign taken  VAERS form completed  Bp 120/79 Medications administered: No medication administered.  Disposition:Instructed to call 911 for trouble breathing, rapid heart rate, dizziness, swelling of tongue or throat.   Immunizations Administered    Name Date Dose VIS Date Route   Pfizer COVID-19 Vaccine 10/05/2019 11:20 AM 0.3 mL 06/20/2019 Intramuscular   Manufacturer: [employer], [employer]   Lot: [id number]   NDC: [address]-1

## 2019-10-05 NOTE — Progress Notes (Signed)
   Covid-19 Vaccination Clinic  Name:  Joan Gonzalez    MRN: [id number] DOB: 06-18-1996  10/05/2019  [name] was observed post Covid-19 immunization for 15 minutes without incident. She was provided with Vaccine Information Sheet and instruction to access the V-Safe system.   [name] was instructed to call 911 with any severe reactions post vaccine: Marland Kitchen Difficulty breathing  . Swelling of face and throat  . A fast heartbeat  . A bad rash all over body  . Dizziness and weakness   Immunizations Administered    Name Date Dose VIS Date Route   Pfizer COVID-19 Vaccine 10/05/2019 11:20 AM 0.3 mL 06/20/2019 Intramuscular   Manufacturer: [employer], [employer]   Lot: [id number]   NDC: [address]-2

## 2019-10-07 ENCOUNTER — Encounter: Payer: Self-pay | Admitting: *Deleted

## 2019-10-10 LAB — PROGESTERONE: Progesterone: 5.3 ng/mL

## 2019-11-11 ENCOUNTER — Ambulatory Visit (INDEPENDENT_AMBULATORY_CARE_PROVIDER_SITE_OTHER): Payer: PRIVATE HEALTH INSURANCE | Admitting: Adult Health

## 2019-11-11 ENCOUNTER — Other Ambulatory Visit: Payer: Self-pay

## 2019-11-11 ENCOUNTER — Encounter: Payer: Self-pay | Admitting: Adult Health

## 2019-11-11 ENCOUNTER — Other Ambulatory Visit (HOSPITAL_COMMUNITY)
Admission: RE | Admit: 2019-11-11 | Discharge: 2019-11-11 | Disposition: A | Discharge status: Home / Self Care | Payer: PRIVATE HEALTH INSURANCE | Source: Ambulatory Visit | Attending: Adult Health | Admitting: Adult Health

## 2019-11-11 VITALS — BP 130/76 | HR 81 | Ht 62.5 in | Wt 156.0 lb

## 2019-11-11 DIAGNOSIS — Z794 Long term (current) use of insulin: Secondary | ICD-10-CM

## 2019-11-11 DIAGNOSIS — Z01419 Encounter for gynecological examination (general) (routine) without abnormal findings: Secondary | ICD-10-CM | POA: Diagnosis not present

## 2019-11-11 DIAGNOSIS — E109 Type 1 diabetes mellitus without complications: Secondary | ICD-10-CM

## 2019-11-11 DIAGNOSIS — Z319 Encounter for procreative management, unspecified: Secondary | ICD-10-CM

## 2019-11-11 NOTE — Progress Notes (Addendum)
Patient ID: Joan Gonzalez, female   DOB: [birth year], 24 y.o.   MRN: [id number] History of Present Illness: Joan Gonzalez is a 24 year old white female,married, G0P0, in for well woman gyn exam and pap. She teaches preschool.  PCP is [facility].   Current Medications, Allergies, Past Medical History, Past Surgical History, Family History and Social History were reviewed in [employer] record.     Review of Systems: Patient denies any headaches, hearing loss, fatigue, blurred vision, shortness of breath, chest pain, abdominal pain, problems with bowel movements, urination, or intercourse. No joint pain or mood swings.    Physical Exam:BP 130/76 (BP Location: Left Arm, Patient Position: Sitting, Cuff Size: Normal)   Pulse 81   Ht 5' 2.5" (1.588 m)   Wt 156 lb (70.8 kg)   LMP 10/19/2019   BMI 28.08 kg/m  General:  Well developed, well nourished, no acute distress Skin:  Warm and dry Neck:  Midline trachea, normal thyroid, good ROM, no lymphadenopathy Lungs; Clear to auscultation bilaterally Breast:  No dominant palpable mass, retraction, or nipple discharge Cardiovascular: Regular rate and rhythm Abdomen:  Soft, non tender, no hepatosplenomegaly Pelvic:  External genitalia is normal in appearance, no lesions.  The vagina is normal in appearance. Urethra has no lesions or masses. The cervix is nulliparous, pap with GC/CHL performed.  Uterus is felt to be normal size, shape, and contour.  No adnexal masses or tenderness noted.Bladder is non tender, no masses felt. Extremities/musculoskeletal:  No swelling or varicosities noted, no clubbing or cyanosis Psych:  No mood changes, alert and cooperative,seems happy AA 3 Fall risk is low PHQ 9 score is 8, no SI. Declines meds Examination chaperoned by Faith Rogue LPN  Impression and Plan: 1. Encounter for gynecological examination with Papanicolaou smear of cervix Pap sent Physical in 1 year Pap in 3 if normal Labs  with PCP and Dr Sharl Ma   2. Type 1 diabetes mellitus without complication (HCC) Sees Dr Sharl Ma and has a pump  3. Patient desires pregnancy Take PNV with folic acid Call after 2 periods and check progesterone level

## 2019-11-13 LAB — CYTOLOGY - PAP
Chlamydia: NEGATIVE
Comment: NEGATIVE
Comment: NORMAL
Diagnosis: NEGATIVE
Neisseria Gonorrhea: NEGATIVE

## 2020-04-28 ENCOUNTER — Other Ambulatory Visit (INDEPENDENT_AMBULATORY_CARE_PROVIDER_SITE_OTHER): Payer: PRIVATE HEALTH INSURANCE | Admitting: *Deleted

## 2020-04-28 ENCOUNTER — Other Ambulatory Visit: Payer: Self-pay

## 2020-04-28 DIAGNOSIS — Z111 Encounter for screening for respiratory tuberculosis: Secondary | ICD-10-CM | POA: Diagnosis not present

## 2020-04-30 LAB — TB SKIN TEST
Induration: 0 mm
TB Skin Test: NEGATIVE

## 2020-05-14 ENCOUNTER — Encounter: Payer: Self-pay | Admitting: Nurse Practitioner

## 2020-05-14 ENCOUNTER — Other Ambulatory Visit: Payer: Self-pay

## 2020-05-14 ENCOUNTER — Ambulatory Visit (INDEPENDENT_AMBULATORY_CARE_PROVIDER_SITE_OTHER): Payer: PRIVATE HEALTH INSURANCE | Admitting: Nurse Practitioner

## 2020-05-14 VITALS — BP 122/74 | HR 92 | Temp 98.6°F | Wt 158.0 lb

## 2020-05-14 DIAGNOSIS — Z23 Encounter for immunization: Secondary | ICD-10-CM

## 2020-05-14 DIAGNOSIS — F32A Depression, unspecified: Secondary | ICD-10-CM | POA: Diagnosis not present

## 2020-05-14 DIAGNOSIS — F419 Anxiety disorder, unspecified: Secondary | ICD-10-CM

## 2020-05-14 MED ORDER — ESCITALOPRAM OXALATE 10 MG PO TABS: 10.0000 mg | ORAL_TABLET | Freq: Every day | ORAL | 0 refills | Status: DC

## 2020-05-14 NOTE — Progress Notes (Signed)
Subjective:    Subjective   Patient ID: Joan Gonzalez, female    DOB: March 07, 1996, 24 y.o.   MRN: [id number]  HPI Patient comes in today to have a health screening form filled out for her new job as a a Geologist, engineering. PMH significant for Type I DM and anxiety. She is insulin dependent via insulin pump. She is followed by endocrinology. Last A1C 6.4 (08/2018). COVID vaccines listed in chart. She denies tobacco, vaping, or illicit drug use. She consumes ETOH occasionally in social settings. LMP 04/24/2020. Patient desires pregnancy at this time, not using contraception. She reports stable relationship with no change in sexual partner in last 6 months. Last pap completed by OBGYN this year. She received eye and dental exams this year.She reports frustration regarding several surgeries for dental implants over the course of this year.  She no longer takes atarax for anxiety but expresses concerns today of feeling stressed and overwhelmed at work. She is tearful and states that she has experienced excessive sleeping and lack of energy in the last month. She denies SI and HI and reports good family support from her mother and partner. Loves her job but her main source of stress comes from work. Works at [employer] and states they are understaffed. Has discussed this with her supervisor but no change.  Does not have any frequent suicidal thoughts and no plans. Has a brief general feeling of wondering if everything would be better off is she was not here. Tired of dealing with the stress. Denies homicidal thoughts.    Review of Systems Review of Systems  Constitutional: Positive for malaise/fatigue. Negative for chills and fever.  Eyes: Negative for blurred vision.  Respiratory: Negative for shortness of breath and wheezing.   Cardiovascular: Negative for chest pain, palpitations and leg swelling.  Gastrointestinal: Negative for abdominal pain, diarrhea, nausea and vomiting.    Psychiatric/Behavioral: Positive for depression. Negative for substance abuse and suicidal ideas. The patient is nervous/anxious.     Depression screen [facility] 2/9 05/14/2020 11/11/2019 09/30/2019 09/30/2019 05/06/2018  Decreased Interest 1 1 1 1  0  Down, Depressed, Hopeless 3 1 1 1  0  PHQ - 2 Score 4 2 2 2  0  Altered sleeping 3 0 2 - -  Tired, decreased energy 2 1 1  - -  Change in appetite 1 1 1  - -  Feeling bad or failure about yourself  3 1 1  - -  Trouble concentrating 1 1 1  - -  Moving slowly or fidgety/restless 0 0 0 - -  Suicidal thoughts 0 0 0 - -  PHQ-9 Score 14 6 8  - -  Difficult doing work/chores Somewhat difficult - - - -   GAD 7 : Generalized Anxiety Score 05/14/2020 11/11/2019  Nervous, Anxious, on Edge 3 1  Control/stop worrying 3 1  Worry too much - different things 3 1  Trouble relaxing 1 1  Restless 1 1  Easily annoyed or irritable 2 1  Afraid - awful might happen 1 1  Total GAD 7 Score 14 7  Anxiety Difficulty Somewhat difficult -        Objective:   Objective  Vitals:   05/14/20 1539  BP: 122/74  Pulse: 92  Temp: 98.6 F (37 C)  SpO2: 99%   Physical Exam Physical Exam Constitutional:      General: She is not in acute distress.    Appearance: Normal appearance. She is not ill-appearing.  Cardiovascular:     Rate and  Rhythm: Normal rate and regular rhythm.     Pulses: Normal pulses.     Heart sounds: No murmur heard.  No friction rub. No gallop.   Pulmonary:     Effort: No respiratory distress.     Breath sounds: No wheezing or rhonchi.  Musculoskeletal:     Cervical back: Normal range of motion.  Skin:    General: Skin is warm and dry.  Neurological:     Mental Status: She is alert.  Psychiatric:        Attention and Perception: Attention normal.        Mood and Affect: Mood is anxious and depressed. Affect is tearful.        Speech: Speech normal.        Behavior: Behavior is cooperative.        Thought Content: Thought content normal.  Thought content does not include homicidal or suicidal ideation. Thought content does not include homicidal or suicidal plan.        Judgment: Judgment normal.    Crying during most of the visit. Makes good eye contact. Dressed appropriately. Thoughts logical, coherent and relevant.     Assessment & Plan:   Problem List Items Addressed This Visit      Other   Anxiety and depression - Primary   Relevant Medications   escitalopram (LEXAPRO) 10 MG tablet    Other Visit Diagnoses    Need for vaccination       Relevant Orders   Flu Vaccine QUAD 6+ mos PF IM (Fluarix Quad PF) (Completed)     Meds ordered this encounter  Medications  . escitalopram (LEXAPRO) 10 MG tablet    Sig: Take 1 tablet (10 mg total) by mouth daily.    Dispense:  30 tablet    Refill:  0    Order Specific Question:   Supervising Provider    Answer:   Lilyan Punt A [[id number]]   Flu vaccine administered today. Lexapro 10 mg daily for depression. Discussed potential adverse effects including worsening of her symptoms. DC medication and contact office if any problems.   Patient instructed to monitor for increased anxiety while taking medication.  Discussed relaxation techniques and physiological signs of stress with patient. Encouraged her to seek other employment if stress level continues.  Encouraged patient to participate in self care   Suicide Contract: Patient verbalized agreement to seek help immediately if experiencing thoughts of harming herself or others.   Return in about 1 month (around 06/13/2020) for recheck; virtual phone.  Send my chart note in a few weeks to touch base about symptoms.  30 minutes was spent with the patient including previsit chart review, time spent with patient, discussion of health issues, review of data including medical record, and documentation of the visit.

## 2020-05-14 NOTE — Progress Notes (Signed)
   Subjective:    Patient ID: Joan Gonzalez, female    DOB: 1996/06/26, 24 y.o.   MRN: [id number]  HPI Patient comes in today to have a health form filled out for her new job.    Review of Systems     Objective:   Physical Exam        Assessment & Plan:

## 2020-05-14 NOTE — Patient Instructions (Signed)
Take Lexapro at bedtime

## 2020-05-15 ENCOUNTER — Encounter: Payer: Self-pay | Admitting: Nurse Practitioner

## 2020-06-10 ENCOUNTER — Encounter: Payer: Self-pay | Admitting: Nurse Practitioner

## 2020-06-11 ENCOUNTER — Other Ambulatory Visit: Payer: Self-pay | Admitting: Nurse Practitioner

## 2020-06-11 MED ORDER — ESCITALOPRAM OXALATE 10 MG PO TABS: 10.0000 mg | ORAL_TABLET | Freq: Every day | ORAL | 0 refills | Status: DC

## 2020-12-29 ENCOUNTER — Other Ambulatory Visit: Payer: Self-pay

## 2020-12-29 ENCOUNTER — Other Ambulatory Visit (HOSPITAL_COMMUNITY)
Admission: RE | Admit: 2020-12-29 | Discharge: 2020-12-29 | Disposition: A | Discharge status: Home / Self Care | Payer: No Typology Code available for payment source | Source: Ambulatory Visit | Attending: Obstetrics & Gynecology | Admitting: Obstetrics & Gynecology

## 2020-12-29 ENCOUNTER — Ambulatory Visit (INDEPENDENT_AMBULATORY_CARE_PROVIDER_SITE_OTHER): Payer: No Typology Code available for payment source | Admitting: *Deleted

## 2020-12-29 ENCOUNTER — Other Ambulatory Visit: Payer: No Typology Code available for payment source

## 2020-12-29 DIAGNOSIS — N898 Other specified noninflammatory disorders of vagina: Secondary | ICD-10-CM

## 2020-12-29 NOTE — Progress Notes (Signed)
   NURSE VISIT- VAGINITIS/STD/POC  SUBJECTIVE:  Joan Gonzalez is a 25 y.o. G0P0000 GYN patientfemale here for a vaginal swab for vaginitis screening, STD screen.  She reports the following symptoms:  vaginal itching   for 5 days. Denies abnormal vaginal bleeding, significant pelvic pain, fever, or UTI symptoms.  OBJECTIVE:  There were no vitals taken for this visit.  Appears well, in no apparent distress  ASSESSMENT: Vaginal swab for vaginitis screening & STD screen  PLAN: Self-collected vaginal probe for Gonorrhea, Chlamydia, Trichomonas, Bacterial Vaginosis, Yeast sent to lab Treatment: to be determined once results are received Follow-up as needed if symptoms persist/worsen, or new symptoms develop  Malachy Mood  12/29/2020 9:31 AM  Chart reviewed for nurse visit. Agree with plan of care.  Cheral Marker, [place] 12/29/2020 9:40 AM

## 2020-12-30 ENCOUNTER — Other Ambulatory Visit: Payer: Self-pay | Admitting: Women's Health

## 2020-12-30 LAB — CERVICOVAGINAL ANCILLARY ONLY
Bacterial Vaginitis (gardnerella): NEGATIVE
Candida Glabrata: NEGATIVE
Candida Vaginitis: POSITIVE — AB
Chlamydia: NEGATIVE
Comment: NEGATIVE
Comment: NEGATIVE
Comment: NEGATIVE
Comment: NEGATIVE
Comment: NEGATIVE
Comment: NORMAL
Neisseria Gonorrhea: NEGATIVE
Trichomonas: NEGATIVE

## 2020-12-30 MED ORDER — FLUCONAZOLE 150 MG PO TABS: 150.0000 mg | ORAL_TABLET | Freq: Once | ORAL | 0 refills | Status: AC

## 2021-02-23 ENCOUNTER — Other Ambulatory Visit (INDEPENDENT_AMBULATORY_CARE_PROVIDER_SITE_OTHER): Payer: Self-pay

## 2021-02-23 ENCOUNTER — Ambulatory Visit: Payer: PRIVATE HEALTH INSURANCE | Admitting: Family Medicine

## 2021-02-23 ENCOUNTER — Other Ambulatory Visit: Payer: Self-pay

## 2021-02-23 DIAGNOSIS — R399 Unspecified symptoms and signs involving the genitourinary system: Secondary | ICD-10-CM

## 2021-02-23 LAB — POCT URINALYSIS DIPSTICK OB
Glucose, UA: NEGATIVE
Ketones, UA: NEGATIVE
Nitrite, UA: NEGATIVE
POC,PROTEIN,UA: NEGATIVE

## 2021-02-23 NOTE — Progress Notes (Signed)
Chart reviewed for nurse visit. Agree with plan of care.  Adline Potter, NP 02/23/2021 4:40 PM

## 2021-02-23 NOTE — Progress Notes (Signed)
   NURSE VISIT- UTI SYMPTOMS   SUBJECTIVE:  Joan Gonzalez is a 25 y.o. G0P0000 female here for UTI symptoms. She is a GYN patient. She reports hematuria, urinary urgency, and burning and odor .  OBJECTIVE:  There were no vitals taken for this visit.  Appears well, in no apparent distress  Results for orders placed or performed in visit on 02/23/21 (from the past 24 hour(s))  POC Urinalysis Dipstick OB   Collection Time: 02/23/21  3:40 PM  Result Value Ref Range   Color, UA     Clarity, UA     Glucose, UA Negative Negative   Bilirubin, UA     Ketones, UA neg    Spec Grav, UA     Blood, UA large    pH, UA     POC,PROTEIN,UA Negative Negative, Trace, Small (1+), Moderate (2+), Large (3+), 4+   Urobilinogen, UA     Nitrite, UA neg    Leukocytes, UA Moderate (2+) (A) Negative   Appearance     Odor      ASSESSMENT: GYN patient with UTI symptoms and negative nitrites  PLAN: Note routed to Joan Gonzalez, AGNP   Rx sent by provider today: No Urine culture sent Call or return to clinic prn if these symptoms worsen or fail to improve as anticipated. Follow-up: as needed   Joan Gonzalez  02/23/2021 3:40 PM

## 2021-02-24 ENCOUNTER — Other Ambulatory Visit: Payer: No Typology Code available for payment source

## 2021-02-24 ENCOUNTER — Telehealth: Payer: Self-pay | Admitting: Adult Health

## 2021-02-24 DIAGNOSIS — R399 Unspecified symptoms and signs involving the genitourinary system: Secondary | ICD-10-CM

## 2021-02-24 LAB — URINALYSIS, ROUTINE W REFLEX MICROSCOPIC
Bilirubin, UA: NEGATIVE
Glucose, UA: NEGATIVE
Ketones, UA: NEGATIVE
Nitrite, UA: NEGATIVE
Protein,UA: NEGATIVE
Specific Gravity, UA: <=1.005 — AB (ref 1.005–1.030)
Urobilinogen, Ur: 0.2 mg/dL (ref 0.2–1.0)
pH, UA: 7 (ref 5.0–7.5)

## 2021-02-24 LAB — URINE CULTURE

## 2021-02-24 LAB — SPECIMEN STATUS REPORT

## 2021-02-24 LAB — MICROSCOPIC EXAMINATION
Casts: NONE SEEN /lpf
Epithelial Cells (non renal): NONE SEEN /hpf (ref 0–10)

## 2021-02-24 MED ORDER — CEPHALEXIN 500 MG PO CAPS: 500.0000 mg | ORAL_CAPSULE | Freq: Three times a day (TID) | ORAL | 0 refills | Status: DC

## 2021-02-24 NOTE — Telephone Encounter (Signed)
I called pt and let her know [facility] cancelled her urine culture, ?temperature, she will come today and give a urine for culture and I will rx keflex 500 mg tid now

## 2021-02-26 LAB — URINE CULTURE

## 2021-04-22 ENCOUNTER — Encounter: Payer: Self-pay | Admitting: Nurse Practitioner

## 2021-04-22 ENCOUNTER — Ambulatory Visit (INDEPENDENT_AMBULATORY_CARE_PROVIDER_SITE_OTHER): Payer: No Typology Code available for payment source | Admitting: Nurse Practitioner

## 2021-04-22 ENCOUNTER — Other Ambulatory Visit: Payer: Self-pay

## 2021-04-22 VITALS — BP 99/61 | HR 81 | Temp 97.9°F | Ht 62.5 in | Wt 152.0 lb

## 2021-04-22 DIAGNOSIS — Z0000 Encounter for general adult medical examination without abnormal findings: Secondary | ICD-10-CM

## 2021-04-22 DIAGNOSIS — Z23 Encounter for immunization: Secondary | ICD-10-CM

## 2021-04-22 NOTE — Progress Notes (Signed)
Subjective:    Patient ID: Joan Gonzalez, female    DOB: [birth year], 25 y.o.   MRN: [id number]  HPI Patient presents today for physical to send to husband's insurance. She is well appearing and has no major complaints. She see's endocrinology for type 1 diabetes. Denies alcohol and tobacco use. Sexually active with husband and declines STD screening. Would like flu vaccine while here today. She is UTD on eye and dental exams. Denies headaches, dizziness, SOB or chest pain. Denies any diarrhea, constipation, or N/V. Foot exam and labs managed by endocrinology and are UTD. Gets PAP smears and breast exams with GYN.    Review of Systems  Constitutional:  Negative for appetite change, chills, fatigue and fever.  HENT:  Negative for sore throat and trouble swallowing.   Respiratory:  Negative for cough, chest tightness, shortness of breath and wheezing.   Cardiovascular:  Negative for chest pain.  Gastrointestinal:  Negative for abdominal distention, abdominal pain, blood in stool, constipation, diarrhea, nausea and vomiting.  Genitourinary:  Negative for difficulty urinating, dysuria, frequency, pelvic pain and urgency.  Neurological:  Negative for dizziness and headaches.  Depression screen [facility] 2/9 04/22/2021 05/14/2020 11/11/2019 09/30/2019 09/30/2019  Decreased Interest 0 1 1 1 1   Down, Depressed, Hopeless 0 3 1 1 1   PHQ - 2 Score 0 4 2 2 2   Altered sleeping 0 3 0 2 -  Tired, decreased energy 0 2 1 1  -  Change in appetite 0 1 1 1  -  Feeling bad or failure about yourself  0 3 1 1  -  Trouble concentrating - 1 1 1  -  Moving slowly or fidgety/restless 0 0 0 0 -  Suicidal thoughts 0 0 0 0 -  PHQ-9 Score 0 14 6 8  -  Difficult doing work/chores - Somewhat difficult - - -       Objective:   Physical Exam Constitutional:      General: She is not in acute distress.    Appearance: Normal appearance.  Neck:     Comments: Thyroid non-tender, no masses or nodules noted. No enlargement or  goiter noted.  Cardiovascular:     Rate and Rhythm: Normal rate and regular rhythm.     Heart sounds: Normal heart sounds. No murmur heard. Pulmonary:     Effort: Pulmonary effort is normal.     Breath sounds: Normal breath sounds.  Abdominal:     General: There is no distension.     Palpations: Abdomen is soft.     Tenderness: There is no abdominal tenderness. There is no guarding or rebound.  Musculoskeletal:     Cervical back: Neck supple.  Lymphadenopathy:     Cervical: No cervical adenopathy.  Skin:    General: Skin is warm and dry.  Neurological:     Mental Status: She is alert and oriented to person, place, and time.  Psychiatric:        Mood and Affect: Mood normal.        Behavior: Behavior normal.        Thought Content: Thought content normal.        Judgment: Judgment normal.     .. Vitals:   04/22/21 1554  BP: 99/61  Pulse: 81  Temp: 97.9 F (36.6 C)  Height: 5' 2.5" (1.588 m)  Weight: 152 lb (68.9 kg)  SpO2: 99%  BMI (Calculated): 27.34        Assessment & Plan:  Routine general medical examination at a health  care facility  Immunization due - Plan: Flu Vaccine QUAD 39mo+IM (Fluarix, Fluzone & Alfiuria Quad PF)  Plan; -encouraged healthy diet and regular physical activity.  -continue staying UTD on eye, dental and foot exams due to Type 1 diabetes.  Continue follow up with endocrinology including regular labs.  Return in about 1 year (around 04/22/2022) for physical.

## 2021-04-22 NOTE — Progress Notes (Signed)
   Subjective:    Patient ID: Joan Gonzalez, female    DOB: 27-Mar-1996, 25 y.o.   MRN: [id number]  HPI The patient comes in today for a wellness visit.  Last PAP 11/11/19- negative  A review of their health history was completed.  A review of medications was also completed.  Any needed refills; no  Eating habits: alright  Falls/  MVA accidents in past few months: no  Regular exercise: cardio. tredmill  Specialist pt sees on regular basis: endocrinology   Preventative health issues were discussed.   Additional concerns: no   Review of Systems     Objective:   Physical Exam        Assessment & Plan:

## 2021-04-23 ENCOUNTER — Encounter: Payer: Self-pay | Admitting: Nurse Practitioner

## 2021-04-24 ENCOUNTER — Ambulatory Visit
Admission: EM | Admit: 2021-04-24 | Discharge: 2021-04-24 | Disposition: A | Discharge status: Home / Self Care | Payer: No Typology Code available for payment source | Attending: Emergency Medicine | Admitting: Emergency Medicine

## 2021-04-24 ENCOUNTER — Encounter: Payer: Self-pay | Admitting: Emergency Medicine

## 2021-04-24 ENCOUNTER — Other Ambulatory Visit: Payer: Self-pay

## 2021-04-24 DIAGNOSIS — H1031 Unspecified acute conjunctivitis, right eye: Secondary | ICD-10-CM | POA: Diagnosis not present

## 2021-04-24 MED ORDER — MOXIFLOXACIN HCL 0.5 % OP SOLN: 1.0000 [drp] | Freq: Three times a day (TID) | OPHTHALMIC | 0 refills | Status: DC

## 2021-04-24 NOTE — ED Provider Notes (Signed)
HPI  SUBJECTIVE:  Joan Gonzalez is a 25 y.o. female who presents with right eye irritation, crusting, green "goopy" drainage and conjunctival injection for the past 2 days.  She wears glasses and contacts.  Denies allergy symptoms.  No pain with extraocular movements, eye pain, headache, fever, periorbital erythema, swelling, visual changes contacts with pinkeye, no chemical exposure.  She does not rub her eyes.  Denies photophobia.  She has tried cool compresses with improvement in her symptoms.  No aggravating factors.  She has a past medical history of allergies and diabetes type 1.  LMP: 9/21.  Denies the possibility being pregnant.  PMD: [facility]    Past Medical History:  Diagnosis Date   Diabetes mellitus without complication (HCC)     Past Surgical History:  Procedure Laterality Date   dental implants     TYMPANOSTOMY TUBE PLACEMENT      Family History  Problem Relation Age of Onset   Asthma Sister    Seizures Sister    Cancer Paternal Grandmother        breast   COPD Paternal Grandfather     Social History   Tobacco Use   Smoking status: Never   Smokeless tobacco: Never  Vaping Use   Vaping Use: Never used  Substance Use Topics   Alcohol use: No    Alcohol/week: 0.0 standard drinks   Drug use: No    No current facility-administered medications for this encounter.  Current Outpatient Medications:    Continuous Blood Gluc Sensor (DEXCOM G6 SENSOR) MISC, See admin instructions., Disp: , Rfl:    Continuous Blood Gluc Transmit (DEXCOM G6 TRANSMITTER) MISC, See admin instructions., Disp: , Rfl:    Insulin Human (INSULIN PUMP) SOLN, Inject into the skin., Disp: , Rfl:    insulin lispro (HUMALOG) 100 UNIT/ML injection, Inject into the skin 3 (three) times daily before meals., Disp: , Rfl:    moxifloxacin (VIGAMOX) 0.5 % ophthalmic solution, Place 1 drop into the right eye 3 (three) times daily. X 7 days, Disp: 3 mL, Rfl: 0  Allergies  Allergen  Reactions   Other Rash    Adhesive tape     ROS  As noted in HPI.   Physical Exam  BP 122/79 (BP Location: Right Arm)   Pulse 90   Temp 98.1 F (36.7 C) (Oral)   Resp 16   LMP 03/30/2021   SpO2 99%   Constitutional: Well developed, well nourished, no acute distress Eyes:  EOMI, PERRLA.  Mild right conjunctival injection.  Mild crusting.  No pain with EOMs.  No direct or consensual photophobia.  No foreign body seen on lid eversion.  No hyphema, corneal foreign body.  No corneal abrasion.  No periorbital erythema, edema, tenderness Corrected visual acuity: Left eye 20/16, right 20/16, bilateral 20/16  HENT: Normocephalic, atraumatic,mucus membranes moist Respiratory: Normal inspiratory effort Cardiovascular: Normal rate GI: nondistended skin: No rash, skin intact Musculoskeletal: no deformities Neurologic: Alert & oriented x 3, no focal neuro deficits Psychiatric: Speech and behavior appropriate   ED Course   Medications - No data to display  Orders Placed This Encounter  Procedures   Visual acuity screening    Standing Status:   Standing    Number of Occurrences:   1    No results found for this or any previous visit (from the past 24 hour(s)). No results found.  ED Clinical Impression  1. Acute conjunctivitis of right eye, unspecified acute conjunctivitis type  ED Assessment/Plan  Patient with a conjunctivitis.  Difficult to tell if this is viral or bacterial.  Home with Systane, cool compresses, wait-and-see prescription of Vigamox eyedrops.  Follow-up with PMD as needed.  Discussed MDM, treatment plan, and plan for follow-up with patient. patient agrees with plan.   Meds ordered this encounter  Medications   DISCONTD: moxifloxacin (VIGAMOX) 0.5 % ophthalmic solution    Sig: Place 1 drop into both eyes 3 (three) times daily. X 7 days    Dispense:  3 mL    Refill:  0   moxifloxacin (VIGAMOX) 0.5 % ophthalmic solution    Sig: Place 1 drop into  the right eye 3 (three) times daily. X 7 days    Dispense:  3 mL    Refill:  0      *This clinic note was created using Scientist, clinical (histocompatibility and immunogenetics). Therefore, there may be occasional mistakes despite careful proofreading.  ?    Domenick Gong, MD 04/25/21 [id number]

## 2021-04-24 NOTE — Discharge Instructions (Addendum)
Warm or cool compresses, whichever feels better.  Systane lubricating eyedrops.  I would wait a day to start the antibiotic eyedrops.  This could be a viral conjunctivitis, which will not respond to antibiotics.

## 2021-04-24 NOTE — ED Triage Notes (Signed)
Right eye drainage and redness x 2 days.

## 2021-05-02 ENCOUNTER — Encounter: Payer: Self-pay | Admitting: Emergency Medicine

## 2021-05-02 ENCOUNTER — Other Ambulatory Visit: Payer: Self-pay

## 2021-05-02 ENCOUNTER — Encounter (HOSPITAL_COMMUNITY): Payer: Self-pay | Admitting: Family Medicine

## 2021-05-02 ENCOUNTER — Ambulatory Visit
Admission: EM | Admit: 2021-05-02 | Discharge: 2021-05-02 | Disposition: A | Discharge status: Home / Self Care | Payer: No Typology Code available for payment source | Attending: Urgent Care | Admitting: Urgent Care

## 2021-05-02 ENCOUNTER — Inpatient Hospital Stay (HOSPITAL_COMMUNITY)
Admission: AD | Admit: 2021-05-02 | Discharge: 2021-05-02 | Disposition: A | Discharge status: Home / Self Care | Payer: No Typology Code available for payment source | Attending: Family Medicine | Admitting: Family Medicine

## 2021-05-02 ENCOUNTER — Inpatient Hospital Stay (HOSPITAL_COMMUNITY): Payer: No Typology Code available for payment source

## 2021-05-02 ENCOUNTER — Encounter: Payer: Self-pay | Admitting: *Deleted

## 2021-05-02 DIAGNOSIS — Z3201 Encounter for pregnancy test, result positive: Secondary | ICD-10-CM

## 2021-05-02 DIAGNOSIS — R030 Elevated blood-pressure reading, without diagnosis of hypertension: Secondary | ICD-10-CM | POA: Insufficient documentation

## 2021-05-02 DIAGNOSIS — R1030 Lower abdominal pain, unspecified: Secondary | ICD-10-CM | POA: Insufficient documentation

## 2021-05-02 DIAGNOSIS — Z3A01 Less than 8 weeks gestation of pregnancy: Secondary | ICD-10-CM | POA: Diagnosis not present

## 2021-05-02 DIAGNOSIS — O3680X0 Pregnancy with inconclusive fetal viability, not applicable or unspecified: Secondary | ICD-10-CM

## 2021-05-02 DIAGNOSIS — O26899 Other specified pregnancy related conditions, unspecified trimester: Secondary | ICD-10-CM

## 2021-05-02 DIAGNOSIS — E109 Type 1 diabetes mellitus without complications: Secondary | ICD-10-CM

## 2021-05-02 DIAGNOSIS — O26891 Other specified pregnancy related conditions, first trimester: Secondary | ICD-10-CM | POA: Diagnosis not present

## 2021-05-02 DIAGNOSIS — R109 Unspecified abdominal pain: Secondary | ICD-10-CM

## 2021-05-02 LAB — WET PREP, GENITAL
Clue Cells Wet Prep HPF POC: NONE SEEN
Sperm: NONE SEEN
Trich, Wet Prep: NONE SEEN
Yeast Wet Prep HPF POC: NONE SEEN

## 2021-05-02 LAB — POCT URINALYSIS DIP (MANUAL ENTRY)
Bilirubin, UA: NEGATIVE
Blood, UA: NEGATIVE
Glucose, UA: NEGATIVE mg/dL
Ketones, POC UA: NEGATIVE mg/dL
Leukocytes, UA: NEGATIVE
Nitrite, UA: NEGATIVE
Protein Ur, POC: NEGATIVE mg/dL
Spec Grav, UA: 1.02 (ref 1.010–1.025)
Urobilinogen, UA: 0.2 E.U./dL
pH, UA: 7.5 (ref 5.0–8.0)

## 2021-05-02 LAB — CBC
HCT: 38.2 % (ref 36.0–46.0)
Hemoglobin: 13 g/dL (ref 12.0–15.0)
MCH: 29.7 pg (ref 26.0–34.0)
MCHC: 34 g/dL (ref 30.0–36.0)
MCV: 87.2 fL (ref 80.0–100.0)
Platelets: 412 10*3/uL — ABNORMAL HIGH (ref 150–400)
RBC: 4.38 MIL/uL (ref 3.87–5.11)
RDW: 12.6 % (ref 11.5–15.5)
WBC: 9.8 10*3/uL (ref 4.0–10.5)
nRBC: 0 % (ref 0.0–0.2)

## 2021-05-02 LAB — POCT URINE PREGNANCY: Preg Test, Ur: POSITIVE — AB

## 2021-05-02 LAB — HCG, QUANTITATIVE, PREGNANCY: hCG, Beta Chain, Quant, S: 237 m[IU]/mL — ABNORMAL HIGH (ref <5)

## 2021-05-02 LAB — ABO/RH: ABO/RH(D): O POS

## 2021-05-02 NOTE — MAU Note (Signed)
Pt reports she had 3 positive HPT this week. Went to urgent care and it was positive also. C/o lower abd cramping on and off for 3 days. Denies any vag bleeding or discharge. Sent from urgent care for U/S.

## 2021-05-02 NOTE — MAU Provider Note (Signed)
History     CSN: [id number]  Arrival date and time: 05/02/21 1018   Event Date/Time   First Provider Initiated Contact with Patient 05/02/21 1101      Chief Complaint  Patient presents with   Abdominal Pain   25 y.o. G1 @[redacted]w[redacted]d  by sure LMP presenting with low abdominal cramping and +HPT. Reports onset of pain 3 days ago. Rates pain 2/10. Has not treated it. Feels like period cramps. Denies VB. Denies urinary sx. Denies GI sx.   OB History     Gravida  1   Para  0   Term  0   Preterm  0   AB  0   Living  0      SAB  0   IAB  0   Ectopic  0   Multiple  0   Live Births  0           Past Medical History:  Diagnosis Date   Diabetes mellitus without complication (HCC)     Past Surgical History:  Procedure Laterality Date   dental implants     DENTAL RESTORATION/EXTRACTION WITH X-RAY     TYMPANOSTOMY TUBE PLACEMENT      Family History  Problem Relation Age of Onset   Asthma Sister    Seizures Sister    Cancer Paternal Grandmother        breast   COPD Paternal Grandfather     Social History   Tobacco Use   Smoking status: Never   Smokeless tobacco: Never  Vaping Use   Vaping Use: Never used  Substance Use Topics   Alcohol use: No    Alcohol/week: 0.0 standard drinks   Drug use: No    Allergies:  Allergies  Allergen Reactions   Other Rash    Adhesive tape    Medications Prior to Admission  Medication Sig Dispense Refill Last Dose   Insulin Human (INSULIN PUMP) SOLN Inject into the skin.   05/02/2021   insulin lispro (HUMALOG) 100 UNIT/ML injection Inject into the skin 3 (three) times daily before meals.   05/02/2021   moxifloxacin (VIGAMOX) 0.5 % ophthalmic solution Place 1 drop into the right eye 3 (three) times daily. X 7 days 3 mL 0 05/01/2021   Continuous Blood Gluc Sensor (DEXCOM G6 SENSOR) MISC See admin instructions.      Continuous Blood Gluc Transmit (DEXCOM G6 TRANSMITTER) MISC See admin instructions.       Review of  Systems  Constitutional:  Negative for fever.  Gastrointestinal:  Positive for abdominal pain. Negative for constipation, diarrhea, nausea and vomiting.  Genitourinary:  Negative for dysuria, frequency, urgency, vaginal bleeding and vaginal discharge.  Physical Exam   Blood pressure (!) 145/68, pulse 96, temperature 98.4 F (36.9 C), resp. rate 18, height 5' 2.25" (1.581 m), weight 69.4 kg, last menstrual period 03/30/2021.  Physical Exam Constitutional:      General: She is not in acute distress.    Appearance: Normal appearance.  HENT:     Head: Normocephalic and atraumatic.  Cardiovascular:     Rate and Rhythm: Normal rate.  Pulmonary:     Effort: Pulmonary effort is normal. No respiratory distress.  Abdominal:     General: There is no distension.     Palpations: Abdomen is soft. There is no mass.     Tenderness: There is no abdominal tenderness. There is no guarding or rebound.     Hernia: No hernia is present.  Musculoskeletal:  General: Normal range of motion.     Cervical back: Normal range of motion.  Skin:    General: Skin is warm and dry.  Neurological:     General: No focal deficit present.     Mental Status: She is alert and oriented to person, place, and time.  Psychiatric:        Mood and Affect: Mood is anxious.        Behavior: Behavior normal.   Results for orders placed or performed during the hospital encounter of 05/02/21 (from the past 24 hour(s))  CBC     Status: Abnormal   Collection Time: 05/02/21 11:31 AM  Result Value Ref Range   WBC 9.8 4.0 - 10.5 K/uL   RBC 4.38 3.87 - 5.11 MIL/uL   Hemoglobin 13.0 12.0 - 15.0 g/dL   HCT 09.4 07.6 - 80.8 %   MCV 87.2 80.0 - 100.0 fL   MCH 29.7 26.0 - 34.0 pg   MCHC 34.0 30.0 - 36.0 g/dL   RDW 81.1 03.1 - 59.4 %   Platelets 412 (H) 150 - 400 K/uL   nRBC 0.0 0.0 - 0.2 %  ABO/Rh     Status: None   Collection Time: 05/02/21 11:31 AM  Result Value Ref Range   ABO/RH(D) O POS    No rh immune globuloin       NOT A RH IMMUNE GLOBULIN CANDIDATE, PT RH POSITIVE Performed at [facility], [address], [place], [place] [address]   hCG, quantitative, pregnancy     Status: Abnormal   Collection Time: 05/02/21 11:31 AM  Result Value Ref Range   hCG, Beta Chain, Quant, S 237 (H) <5 mIU/mL  Wet prep, genital     Status: Abnormal   Collection Time: 05/02/21 11:45 AM   Specimen: Vaginal  Result Value Ref Range   Yeast Wet Prep HPF POC NONE SEEN NONE SEEN   Trich, Wet Prep NONE SEEN NONE SEEN   Clue Cells Wet Prep HPF POC NONE SEEN NONE SEEN   WBC, Wet Prep HPF POC MODERATE (A) NONE SEEN   Sperm NONE SEEN    US OB LESS THAN 14 WEEKS WITH OB TRANSVAGINAL  Result Date: 05/02/2021 CLINICAL DATA:  Abdominal pain. Positive urine pregnancy test however serum beta hCG not available at time dictation. EXAM: OBSTETRIC <14 WK ULTRASOUND TECHNIQUE: Transabdominal ultrasound was performed for evaluation of the gestation as well as the maternal uterus and adnexal regions. COMPARISON:  None. FINDINGS: Intrauterine gestational sac: None Yolk sac:  Not Visualized. Embryo:  Not Visualized. Cardiac Activity: Not Visualized. Maternal uterus/adnexae: Uterus and adnexa appear normal. Small volume simple appearing pelvic free fluid. IMPRESSION: Non-localization of the pregnancy on this scan. No intrauterine gestational sac. No abnormal ovarian or adnexal masses. Sonographic differential diagnosis includes intrauterine gestation too early to visualize, spontaneous abortion or occult ectopic gestation. Recommend close clinical follow-up and serial serum beta HCG monitoring, with repeat obstetric scan as warranted by beta HCG levels and clinical assessment. Electronically Signed   By: Maudry Mayhew M.D.   On: 05/02/2021 12:49    MAU Course  Procedures  MDM Labs and [place] ordered and reviewed. No IUGS, YS or FP seen on [place], findings could indicate early pregnancy, ectopic pregnancy, or failed pregnancy, discussed with pt.  Will follow quant in 48 hrs. Stable for discharge home.   Assessment and Plan   1. Pregnancy, location unknown   2. Abdominal pain affecting pregnancy   3. Elevated blood  pressure reading without diagnosis of hypertension    Discharge home Follow up at [facility] in 10/26- message sent SAB/ectopic precautions  Allergies as of 05/02/2021       Reactions   Other Rash   Adhesive tape        Medication List     TAKE these medications    Dexcom G6 Sensor Misc See admin instructions.   Dexcom G6 Transmitter Misc See admin instructions.   insulin lispro 100 UNIT/ML injection Commonly known as: HUMALOG Inject into the skin 3 (three) times daily before meals.   insulin pump Soln Inject into the skin.   moxifloxacin 0.5 % ophthalmic solution Commonly known as: Vigamox Place 1 drop into the right eye 3 (three) times daily. X 7 days       Donette Larry, CNM 05/02/2021, 1:55 PM

## 2021-05-02 NOTE — Discharge Instructions (Addendum)
[facility] for [facility] given your abdominal cramping/pains and positive pregnancy test.

## 2021-05-02 NOTE — ED Provider Notes (Signed)
[facility]   MRN: [id number] DOB: [birth year]  Subjective:   Joan Gonzalez is a 25 y.o. female presenting for 1 day history of persistent intermittent lower abdominal pains, cramping over mid to left lower side, intermittent nausea without vomiting.  Patient took a pregnancy test that she is trying to have a pregnancy and was positive last night.  Denies fever, vaginal discharge, vaginal bleeding, dysuria, urinary frequency, constipation, diarrhea.  Patient is a type I diabetic.  No current facility-administered medications for this encounter.  Current Outpatient Medications:    Continuous Blood Gluc Sensor (DEXCOM G6 SENSOR) MISC, See admin instructions., Disp: , Rfl:    Continuous Blood Gluc Transmit (DEXCOM G6 TRANSMITTER) MISC, See admin instructions., Disp: , Rfl:    Insulin Human (INSULIN PUMP) SOLN, Inject into the skin., Disp: , Rfl:    insulin lispro (HUMALOG) 100 UNIT/ML injection, Inject into the skin 3 (three) times daily before meals., Disp: , Rfl:    moxifloxacin (VIGAMOX) 0.5 % ophthalmic solution, Place 1 drop into the right eye 3 (three) times daily. X 7 days, Disp: 3 mL, Rfl: 0   Allergies  Allergen Reactions   Other Rash    Adhesive tape    Past Medical History:  Diagnosis Date   Diabetes mellitus without complication (HCC)      Past Surgical History:  Procedure Laterality Date   dental implants     TYMPANOSTOMY TUBE PLACEMENT      Family History  Problem Relation Age of Onset   Asthma Sister    Seizures Sister    Cancer Paternal Grandmother        breast   COPD Paternal Grandfather     Social History   Tobacco Use   Smoking status: Never   Smokeless tobacco: Never  Vaping Use   Vaping Use: Never used  Substance Use Topics   Alcohol use: No    Alcohol/week: 0.0 standard drinks   Drug use: No    ROS   Objective:   Vitals: BP (!) 148/77   Pulse 87   Temp 97.9 F (36.6 C)   Resp 18   LMP 03/30/2021   SpO2 98%    Physical Exam Constitutional:      General: She is not in acute distress.    Appearance: Normal appearance. She is well-developed. She is not ill-appearing, toxic-appearing or diaphoretic.  HENT:     Head: Normocephalic and atraumatic.     Right Ear: External ear normal.     Left Ear: External ear normal.     Nose: Nose normal.     Mouth/Throat:     Mouth: Mucous membranes are moist.  Eyes:     General: No scleral icterus.       Right eye: No discharge.        Left eye: No discharge.     Extraocular Movements: Extraocular movements intact.     Conjunctiva/sclera: Conjunctivae normal.     Pupils: Pupils are equal, round, and reactive to light.  Cardiovascular:     Rate and Rhythm: Normal rate and regular rhythm.     Pulses: Normal pulses.     Heart sounds: Normal heart sounds. No murmur heard.   No friction rub. No gallop.  Pulmonary:     Effort: Pulmonary effort is normal. No respiratory distress.     Breath sounds: Normal breath sounds. No stridor. No wheezing, rhonchi or rales.  Abdominal:     General: Bowel sounds are normal. There is no  distension.     Palpations: Abdomen is soft. There is no mass.     Tenderness: There is no abdominal tenderness. There is no right CVA tenderness, left CVA tenderness, guarding or rebound.  Skin:    General: Skin is warm and dry.     Findings: No rash.  Neurological:     General: No focal deficit present.     Mental Status: She is alert and oriented to person, place, and time.  Psychiatric:        Mood and Affect: Mood normal.        Behavior: Behavior normal.        Thought Content: Thought content normal.        Judgment: Judgment normal.    Results for orders placed or performed during the hospital encounter of 05/02/21 (from the past 24 hour(s))  POCT urine pregnancy     Status: Abnormal   Collection Time: 05/02/21  9:18 AM  Result Value Ref Range   Preg Test, Ur Positive (A) Negative  POCT urinalysis dipstick     Status:  None   Collection Time: 05/02/21  9:37 AM  Result Value Ref Range   Color, UA yellow yellow   Clarity, UA clear clear   Glucose, UA negative negative mg/dL   Bilirubin, UA negative negative   Ketones, POC UA negative negative mg/dL   Spec Grav, UA 9.833 8.250 - 1.025   Blood, UA negative negative   pH, UA 7.5 5.0 - 8.0   Protein Ur, POC negative negative mg/dL   Urobilinogen, UA 0.2 0.2 or 1.0 E.U./dL   Nitrite, UA Negative Negative   Leukocytes, UA Negative Negative    Assessment and Plan :   PDMP not reviewed this encounter.  1. Positive pregnancy test   2. Abdominal cramping   3. Type 1 diabetes mellitus without complication (HCC)    Given her abdominal pains, positive pregnancy test recommended further evaluation with the [facility].  Vital signs are hemodynamically stable, she has a family member that could drive her there and will do this.   Wallis Bamberg, [place] 05/02/21 [phone]

## 2021-05-02 NOTE — ED Triage Notes (Signed)
Pt is present today with lower abdominal pain and concerns for pregnancy. Pt states that she took a pregnancy test last night at home and the results were positive.

## 2021-05-03 LAB — GC/CHLAMYDIA PROBE AMP (~~LOC~~) NOT AT ARMC
Chlamydia: NEGATIVE
Comment: NEGATIVE
Comment: NORMAL
Neisseria Gonorrhea: NEGATIVE

## 2021-05-04 ENCOUNTER — Other Ambulatory Visit: Payer: No Typology Code available for payment source

## 2021-05-04 DIAGNOSIS — O26899 Other specified pregnancy related conditions, unspecified trimester: Secondary | ICD-10-CM

## 2021-05-05 ENCOUNTER — Other Ambulatory Visit: Payer: Self-pay | Admitting: Adult Health

## 2021-05-05 DIAGNOSIS — O3680X0 Pregnancy with inconclusive fetal viability, not applicable or unspecified: Secondary | ICD-10-CM

## 2021-05-05 LAB — BETA HCG QUANT (REF LAB): hCG Quant: 561 m[IU]/mL

## 2021-05-05 NOTE — Progress Notes (Signed)
Ck progesterone and QHCG in am

## 2021-05-07 LAB — BETA HCG QUANT (REF LAB): hCG Quant: 1266 m[IU]/mL

## 2021-05-07 LAB — PROGESTERONE: Progesterone: 17.7 ng/mL

## 2021-05-17 ENCOUNTER — Other Ambulatory Visit: Payer: Self-pay

## 2021-05-17 ENCOUNTER — Other Ambulatory Visit: Payer: Self-pay | Admitting: Adult Health

## 2021-05-17 DIAGNOSIS — O3680X0 Pregnancy with inconclusive fetal viability, not applicable or unspecified: Secondary | ICD-10-CM

## 2021-05-18 ENCOUNTER — Ambulatory Visit (INDEPENDENT_AMBULATORY_CARE_PROVIDER_SITE_OTHER): Payer: No Typology Code available for payment source

## 2021-05-18 ENCOUNTER — Other Ambulatory Visit: Payer: Self-pay

## 2021-05-18 DIAGNOSIS — Z3A01 Less than 8 weeks gestation of pregnancy: Secondary | ICD-10-CM

## 2021-05-18 DIAGNOSIS — O3680X0 Pregnancy with inconclusive fetal viability, not applicable or unspecified: Secondary | ICD-10-CM | POA: Diagnosis not present

## 2021-05-18 NOTE — Progress Notes (Signed)
Korea 6+5 wks GS with YS,GS 18.4 mm,no fetal pole visualized,normal ovaries,f/u ultrasound 10-14 days per Joellyn Haff

## 2021-05-30 ENCOUNTER — Encounter: Payer: Self-pay | Admitting: Emergency Medicine

## 2021-05-30 ENCOUNTER — Ambulatory Visit
Admission: EM | Admit: 2021-05-30 | Discharge: 2021-05-30 | Disposition: A | Discharge status: Home / Self Care | Payer: No Typology Code available for payment source | Attending: Internal Medicine | Admitting: Internal Medicine

## 2021-05-30 ENCOUNTER — Telehealth: Payer: Self-pay | Admitting: Women's Health

## 2021-05-30 ENCOUNTER — Other Ambulatory Visit: Payer: Self-pay

## 2021-05-30 DIAGNOSIS — R051 Acute cough: Secondary | ICD-10-CM

## 2021-05-30 DIAGNOSIS — J029 Acute pharyngitis, unspecified: Secondary | ICD-10-CM | POA: Diagnosis not present

## 2021-05-30 NOTE — ED Triage Notes (Signed)
PT reports cough, body aches that started yesterday.

## 2021-05-30 NOTE — ED Provider Notes (Signed)
[facility]    CSN: [id number] Arrival date & time: 05/30/21  0831      History   Chief Complaint Chief Complaint  Patient presents with   Cough    HPI Joan Gonzalez is a 25 y.o. female currently [redacted] weeks pregnant comes to urgent care with 1 day history of sore throat, nonproductive cough and generalized body aches.  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  No nausea, vomiting or diarrhea.  No dizziness, near syncope or syncopal episodes.  Patient denies any fever or chills.  No bleeding or vaginal spotting.  No lower abdominal pain or bloating. HPI  Past Medical History:  Diagnosis Date   Diabetes mellitus without complication [facility])     Patient Active Problem List   Diagnosis Date Noted   Type 1 diabetes mellitus without complication (HCC) 11/11/2019   Patient desires pregnancy 09/30/2019   Anxiety and depression 09/30/2019   Type 1 diabetes mellitus (HCC) 11/16/2015    Past Surgical History:  Procedure Laterality Date   dental implants     DENTAL RESTORATION/EXTRACTION WITH X-RAY     TYMPANOSTOMY TUBE PLACEMENT      OB History     Gravida  1   Para  0   Term  0   Preterm  0   AB  0   Living  0      SAB  0   IAB  0   Ectopic  0   Multiple  0   Live Births  0            Home Medications    Prior to Admission medications   Medication Sig Start Date End Date Taking? Authorizing Provider  insulin lispro (HUMALOG) 100 UNIT/ML injection Inject into the skin 3 (three) times daily before meals.   Yes [provider]  Continuous Blood Gluc Sensor (DEXCOM G6 SENSOR) MISC See admin instructions. 06/04/19   [provider]  Continuous Blood Gluc Transmit (DEXCOM G6 TRANSMITTER) MISC See admin instructions. 06/04/19   [provider]  Insulin Human (INSULIN PUMP) SOLN Inject into the skin.    [provider]  moxifloxacin (VIGAMOX) 0.5 % ophthalmic solution Place 1 drop  into the right eye 3 (three) times daily. X 7 days 04/24/21   Domenick Gong, MD    Family History Family History  Problem Relation Age of Onset   Asthma Sister    Seizures Sister    Cancer Paternal Grandmother        breast   COPD Paternal Grandfather     Social History Social History   Tobacco Use   Smoking status: Never   Smokeless tobacco: Never  Vaping Use   Vaping Use: Never used  Substance Use Topics   Alcohol use: No    Alcohol/week: 0.0 standard drinks   Drug use: No     Allergies   Other   Review of Systems Review of Systems  Constitutional: Negative.   HENT:  Positive for congestion and sore throat. Negative for postnasal drip.   Respiratory:  Positive for cough. Negative for shortness of breath and wheezing.   Cardiovascular:  Negative for chest pain.  Musculoskeletal:  Positive for myalgias.  Neurological:  Positive for headaches.    Physical Exam Triage Vital Signs ED Triage Vitals  Enc Vitals Group     BP 05/30/21 0945 120/77     Pulse Rate 05/30/21 0945 98     Resp 05/30/21  0945 16     Temp 05/30/21 0945 98.9 F (37.2 C)     Temp Source 05/30/21 0945 Oral     SpO2 05/30/21 0945 98 %     Weight --      Height --      Head Circumference --      Peak Flow --      Pain Score 05/30/21 0943 3     Pain Loc --      Pain Edu? --      Excl. in GC? --    No data found.  Updated Vital Signs BP 120/77   Pulse 98   Temp 98.9 F (37.2 C) (Oral)   Resp 16   LMP 03/30/2021   SpO2 98%   Visual Acuity Right Eye Distance:   Left Eye Distance:   Bilateral Distance:    Right Eye Near:   Left Eye Near:    Bilateral Near:     Physical Exam Vitals and nursing note reviewed.  Constitutional:      General: She is not in acute distress.    Appearance: She is not ill-appearing.  HENT:     Right Ear: Tympanic membrane normal.     Left Ear: Tympanic membrane normal.  Cardiovascular:     Rate and Rhythm: Normal rate and regular rhythm.      Pulses: Normal pulses.     Heart sounds: Normal heart sounds.  Pulmonary:     Effort: Pulmonary effort is normal.     Breath sounds: Normal breath sounds.  Abdominal:     General: Bowel sounds are normal.     Palpations: Abdomen is soft.  Neurological:     Mental Status: She is alert.     UC Treatments / Results  Labs (all labs ordered are listed, but only abnormal results are displayed) Labs Reviewed  COVID-19, FLU A+B NAA    EKG   Radiology No results found.  Procedures Procedures (including critical care time)  Medications Ordered in UC Medications - No data to display  Initial Impression / Assessment and Plan / UC Course  I have reviewed the triage vital signs and the nursing notes.  Pertinent labs & imaging results that were available during my care of the patient were reviewed by me and considered in my medical decision making (see chart for details).     1.  Acute viral pharyngitis: COVID-19/flu a plus B test has been sent Increase oral fluid intake Tylenol as needed for fever and/or body aches Return to urgent care if symptoms worsen. Final Clinical Impressions(s) / UC Diagnoses   Final diagnoses:  Acute cough  Acute viral pharyngitis     Discharge Instructions      Maintain adequate hydration Home remedies for cough recommended-warm tea, honey Tylenol as needed for pain and/or fever We will call you with recommendations if labs are abnormal.    ED Prescriptions   None    PDMP not reviewed this encounter.   Merrilee Jansky, MD 05/30/21 1037

## 2021-05-30 NOTE — Discharge Instructions (Signed)
Maintain adequate hydration Home remedies for cough recommended-warm tea, honey Tylenol as needed for pain and/or fever We will call you with recommendations if labs are abnormal.

## 2021-05-30 NOTE — Telephone Encounter (Signed)
Patient's husband called stating that Joan Gonzalez has flu like symptoms with a low grade fever and body aches. Patient and her husband would like to know what she could take for this. Patient is in her first trimester and would like to know if this is normal. Please contact pt

## 2021-05-31 ENCOUNTER — Telehealth: Payer: Self-pay | Admitting: Family Medicine

## 2021-05-31 ENCOUNTER — Telehealth (HOSPITAL_COMMUNITY): Payer: Self-pay | Admitting: Emergency Medicine

## 2021-05-31 ENCOUNTER — Other Ambulatory Visit: Payer: Self-pay | Admitting: Family Medicine

## 2021-05-31 LAB — COVID-19, FLU A+B NAA
Influenza A, NAA: DETECTED — AB
Influenza B, NAA: NOT DETECTED
SARS-CoV-2, NAA: NOT DETECTED

## 2021-05-31 MED ORDER — OSELTAMIVIR PHOSPHATE 75 MG PO CAPS: 75.0000 mg | ORAL_CAPSULE | Freq: Two times a day (BID) | ORAL | 0 refills | Status: AC

## 2021-05-31 NOTE — Telephone Encounter (Signed)
Patient notified per Dr Adriana Simas- Tamiflu sent to pharmacy and is the preferred medication in setting of pregnancy. Patient may also use tylenol and should watch her sugars as she is type 1 diabetic. Call or seek care if any problems. Patient verbalized understanding.

## 2021-05-31 NOTE — Telephone Encounter (Signed)
Tommie Sams, DO    Rx sent for Tamiflu. This is the preferred medication for influenza (in setting of pregnancy).

## 2021-05-31 NOTE — Telephone Encounter (Signed)
Patient tested positive for the flu. She is pregnant and wanted to know what she needed to do/take. Stated she just needed some medical guidance. Please advise.   CB#  [phone]

## 2021-06-01 ENCOUNTER — Other Ambulatory Visit: Payer: Self-pay

## 2021-06-01 NOTE — Telephone Encounter (Signed)
Patient decided against Tamiflu prescription

## 2021-06-03 ENCOUNTER — Other Ambulatory Visit: Payer: Self-pay

## 2021-06-03 ENCOUNTER — Encounter (HOSPITAL_COMMUNITY): Payer: Self-pay | Admitting: Obstetrics and Gynecology

## 2021-06-03 ENCOUNTER — Inpatient Hospital Stay (HOSPITAL_COMMUNITY)
Admission: AD | Admit: 2021-06-03 | Discharge: 2021-06-03 | Disposition: A | Discharge status: Home / Self Care | Payer: No Typology Code available for payment source | Attending: Obstetrics and Gynecology | Admitting: Obstetrics and Gynecology

## 2021-06-03 ENCOUNTER — Inpatient Hospital Stay (HOSPITAL_COMMUNITY): Payer: No Typology Code available for payment source

## 2021-06-03 DIAGNOSIS — O99511 Diseases of the respiratory system complicating pregnancy, first trimester: Secondary | ICD-10-CM | POA: Insufficient documentation

## 2021-06-03 DIAGNOSIS — O209 Hemorrhage in early pregnancy, unspecified: Secondary | ICD-10-CM | POA: Diagnosis present

## 2021-06-03 DIAGNOSIS — J101 Influenza due to other identified influenza virus with other respiratory manifestations: Secondary | ICD-10-CM | POA: Diagnosis not present

## 2021-06-03 DIAGNOSIS — O26891 Other specified pregnancy related conditions, first trimester: Secondary | ICD-10-CM | POA: Insufficient documentation

## 2021-06-03 DIAGNOSIS — Z3491 Encounter for supervision of normal pregnancy, unspecified, first trimester: Secondary | ICD-10-CM

## 2021-06-03 DIAGNOSIS — R109 Unspecified abdominal pain: Secondary | ICD-10-CM | POA: Insufficient documentation

## 2021-06-03 DIAGNOSIS — Z3A09 9 weeks gestation of pregnancy: Secondary | ICD-10-CM | POA: Diagnosis not present

## 2021-06-03 DIAGNOSIS — O3680X0 Pregnancy with inconclusive fetal viability, not applicable or unspecified: Secondary | ICD-10-CM | POA: Diagnosis not present

## 2021-06-03 LAB — URINALYSIS, ROUTINE W REFLEX MICROSCOPIC
Bacteria, UA: NONE SEEN
Bilirubin Urine: NEGATIVE
Glucose, UA: >=500 mg/dL — AB
Ketones, ur: NEGATIVE mg/dL
Leukocytes,Ua: NEGATIVE
Nitrite: NEGATIVE
Protein, ur: NEGATIVE mg/dL
Specific Gravity, Urine: 1.028 (ref 1.005–1.030)
pH: 5 (ref 5.0–8.0)

## 2021-06-03 LAB — CBC
HCT: 36.3 % (ref 36.0–46.0)
Hemoglobin: 12.5 g/dL (ref 12.0–15.0)
MCH: 30.2 pg (ref 26.0–34.0)
MCHC: 34.4 g/dL (ref 30.0–36.0)
MCV: 87.7 fL (ref 80.0–100.0)
Platelets: 295 10*3/uL (ref 150–400)
RBC: 4.14 MIL/uL (ref 3.87–5.11)
RDW: 12 % (ref 11.5–15.5)
WBC: 5.2 10*3/uL (ref 4.0–10.5)
nRBC: 0 % (ref 0.0–0.2)

## 2021-06-03 LAB — HCG, QUANTITATIVE, PREGNANCY: hCG, Beta Chain, Quant, S: 22678 m[IU]/mL — ABNORMAL HIGH (ref <5)

## 2021-06-03 NOTE — Discharge Instructions (Signed)

## 2021-06-03 NOTE — MAU Note (Signed)
Joan Gonzalez is a 25 y.o. at [redacted]w[redacted]d here in MAU reporting: started having some vaginal bleeding and abdominal pain this morning. States she has seen the bleeding into the toilet twice with a few small clots. States not wearing a pad. Last IC was this morning.   Onset of complaint: today  Pain score: 2/10  Vitals:   06/03/21 0845  BP: 134/79  Pulse: (!) 101  Resp: 16  Temp: 98.3 F (36.8 C)  SpO2: 97%     Lab orders placed from triage: UA

## 2021-06-03 NOTE — MAU Provider Note (Signed)
History     CSN: [id number]  Arrival date and time: 06/03/21 5035   Event Date/Time   First Provider Initiated Contact with Patient 06/03/21 [phone]      Chief Complaint  Patient presents with   Abdominal Pain   Vaginal Bleeding   HPI Joan Gonzalez is a 25 y.o. G1P0000 at [redacted]w[redacted]d who presents with vaginal bleeding. She states she had intercourse this am and after saw some spotting. She reports it is bright red when she wipes. Since her arrival to MAU, the bleeding has stopped. She reports some cramping earlier today but none now.   She was seen at the end of October for [facility] with normal rise. On her initial ultrasound, there was gestational sac with a yolk sac but no fetal pole yet.   OB History     Gravida  1   Para  0   Term  0   Preterm  0   AB  0   Living  0      SAB  0   IAB  0   Ectopic  0   Multiple  0   Live Births  0           Past Medical History:  Diagnosis Date   Diabetes mellitus without complication (HCC)     Past Surgical History:  Procedure Laterality Date   dental implants     DENTAL RESTORATION/EXTRACTION WITH X-RAY     TYMPANOSTOMY TUBE PLACEMENT      Family History  Problem Relation Age of Onset   Asthma Sister    Seizures Sister    Cancer Paternal Grandmother        breast   COPD Paternal Grandfather     Social History   Tobacco Use   Smoking status: Never   Smokeless tobacco: Never  Vaping Use   Vaping Use: Never used  Substance Use Topics   Alcohol use: No    Alcohol/week: 0.0 standard drinks   Drug use: No    Allergies: No Active Allergies  Medications Prior to Admission  Medication Sig Dispense Refill Last Dose   Continuous Blood Gluc Sensor (DEXCOM G6 SENSOR) MISC See admin instructions.      Continuous Blood Gluc Transmit (DEXCOM G6 TRANSMITTER) MISC See admin instructions.      Insulin Human (INSULIN PUMP) SOLN Inject into the skin.      insulin lispro (HUMALOG) 100 UNIT/ML injection Inject into the  skin 3 (three) times daily before meals.      moxifloxacin (VIGAMOX) 0.5 % ophthalmic solution Place 1 drop into the right eye 3 (three) times daily. X 7 days 3 mL 0    oseltamivir (TAMIFLU) 75 MG capsule Take 1 capsule (75 mg total) by mouth 2 (two) times daily for 5 days. 10 capsule 0     Review of Systems  Constitutional: Negative.  Negative for fatigue and fever.  HENT: Negative.    Respiratory: Negative.  Negative for shortness of breath.   Cardiovascular: Negative.  Negative for chest pain.  Gastrointestinal:  Positive for abdominal pain. Negative for constipation, diarrhea, nausea and vomiting.  Genitourinary:  Positive for vaginal bleeding. Negative for dysuria and vaginal discharge.  Neurological: Negative.  Negative for dizziness and headaches.  Physical Exam   Blood pressure 134/79, pulse (!) 101, temperature 98.3 F (36.8 C), temperature source Oral, resp. rate 16, height 5\' 2"  (1.575 m), weight 71.3 kg, last menstrual period 03/30/2021, SpO2 97 %.  Physical Exam Vitals and  nursing note reviewed.  Constitutional:      General: She is not in acute distress.    Appearance: She is well-developed.  HENT:     Head: Normocephalic.  Eyes:     Pupils: Pupils are equal, round, and reactive to light.  Cardiovascular:     Rate and Rhythm: Normal rate and regular rhythm.     Heart sounds: Normal heart sounds.  Pulmonary:     Effort: Pulmonary effort is normal. No respiratory distress.     Breath sounds: Normal breath sounds.  Abdominal:     General: Bowel sounds are normal. There is no distension.     Palpations: Abdomen is soft.     Tenderness: There is no abdominal tenderness.  Skin:    General: Skin is warm and dry.  Neurological:     Mental Status: She is alert and oriented to person, place, and time.  Psychiatric:        Mood and Affect: Mood normal.        Behavior: Behavior normal.        Thought Content: Thought content normal.        Judgment: Judgment normal.     MAU Course  Procedures Results for orders placed or performed during the hospital encounter of 06/03/21 (from the past 24 hour(s))  Urinalysis, Routine w reflex microscopic Urine, Clean Catch     Status: Abnormal   Collection Time: 06/03/21  8:36 AM  Result Value Ref Range   Color, Urine YELLOW YELLOW   APPearance CLEAR CLEAR   Specific Gravity, Urine 1.028 1.005 - 1.030   pH 5.0 5.0 - 8.0   Glucose, UA >=500 (A) NEGATIVE mg/dL   Hgb urine dipstick SMALL (A) NEGATIVE   Bilirubin Urine NEGATIVE NEGATIVE   Ketones, ur NEGATIVE NEGATIVE mg/dL   Protein, ur NEGATIVE NEGATIVE mg/dL   Nitrite NEGATIVE NEGATIVE   Leukocytes,Ua NEGATIVE NEGATIVE   WBC, UA 0-5 0 - 5 WBC/hpf   Bacteria, UA NONE SEEN NONE SEEN   Squamous Epithelial / LPF 0-5 0 - 5  CBC     Status: None   Collection Time: 06/03/21  8:44 AM  Result Value Ref Range   WBC 5.2 4.0 - 10.5 K/uL   RBC 4.14 3.87 - 5.11 MIL/uL   Hemoglobin 12.5 12.0 - 15.0 g/dL   HCT 24.2 35.3 - 61.4 %   MCV 87.7 80.0 - 100.0 fL   MCH 30.2 26.0 - 34.0 pg   MCHC 34.4 30.0 - 36.0 g/dL   RDW 43.1 54.0 - 08.6 %   Platelets 295 150 - 400 K/uL   nRBC 0.0 0.0 - 0.2 %  hCG, quantitative, pregnancy     Status: Abnormal   Collection Time: 06/03/21  8:44 AM  Result Value Ref Range   hCG, Beta Chain, Quant, S 22,678 (H) <5 mIU/mL    US OB Transvaginal  Result Date: 06/03/2021 CLINICAL DATA:  Vaginal bleeding. Pregnant patient, 9 weeks and 2 days based on the last menstrual period. EXAM: TRANSVAGINAL OB ULTRASOUND TECHNIQUE: Transvaginal ultrasound was performed for complete evaluation of the gestation as well as the maternal uterus, adnexal regions, and pelvic cul-de-sac. COMPARISON:  None. FINDINGS: Intrauterine gestational sac: Single Yolk sac:  Visualized. Embryo:  Visualized. Cardiac Activity: Not Visualized. Heart Rate: NA CRL:   6.5 mm   6 w 3 d                  [place] EDC: 01/24/2022 Subchorionic hemorrhage:  None  visualized. Maternal  uterus/adnexae: No uterine masses. Cervix unremarkable. Ovaries and adnexa within normal limits. No abnormal pelvic free fluid. IMPRESSION: 1. Gestational sac, yolk sac and embryo, but no visualized cardiac activity. Findings are suspicious but not yet definitive for failed pregnancy. Recommend follow-up US in 10-14 days for definitive diagnosis. This recommendation follows SRU consensus guidelines: Diagnostic Criteria for Nonviable Pregnancy Early in the First Trimester. Malva Limes Med 2013; 947:6546-50. 2. Exam otherwise unremarkable. Electronically Signed   By: Amie Portland M.D.   On: 06/03/2021 09:19     MDM UA CBC, HCG US OB Transvaginal  Assessment and Plan   1. Normal intrauterine pregnancy on prenatal ultrasound in first trimester   2. Pregnancy with uncertain fetal viability, single or unspecified fetus   3. [redacted] weeks gestation of pregnancy   4. Influenza A    -Discharge home in stable condition -First trimester precautions discussed -Patient advised to follow-up with OB at 2 weeks for repeat ultrasound -Patient may return to MAU as needed or if her condition were to change or worsen   Rolm Bookbinder CNM 06/03/2021, 8:46 AM

## 2021-06-04 ENCOUNTER — Inpatient Hospital Stay (HOSPITAL_COMMUNITY)
Admission: AD | Admit: 2021-06-04 | Discharge: 2021-06-04 | Disposition: A | Discharge status: Home / Self Care | Payer: No Typology Code available for payment source | Attending: Obstetrics & Gynecology | Admitting: Obstetrics & Gynecology

## 2021-06-04 ENCOUNTER — Other Ambulatory Visit: Payer: Self-pay

## 2021-06-04 DIAGNOSIS — O034 Incomplete spontaneous abortion without complication: Secondary | ICD-10-CM | POA: Insufficient documentation

## 2021-06-04 LAB — HCG, QUANTITATIVE, PREGNANCY: hCG, Beta Chain, Quant, S: 13056 m[IU]/mL — ABNORMAL HIGH (ref <5)

## 2021-06-04 NOTE — MAU Note (Signed)
Joan Gonzalez is a 25 y.o. at [redacted]w[redacted]d here in MAU reporting: worsening cramping since yesterday and states she is still bleeding. Is not wearing a pad but sees bleeding when she wipes and some clots into the toilet.   Onset of complaint: ongoing  Pain score: 5/10  Vitals:   06/04/21 1446  BP: 135/78  Pulse: 86  Resp: 16  Temp: 98.2 F (36.8 C)  SpO2: 100%     Lab orders placed from triage: hcg

## 2021-06-04 NOTE — MAU Provider Note (Signed)
Event Date/Time   First Provider Initiated Contact with Patient 06/04/21 1502     S Ms. Joan Gonzalez is a 25 y.o. G1P0000 pregnant patient at [redacted]w[redacted]d who presents to MAU today with complaint of increased vaginal bleeding and cramping. Was seen yesterday in MAU for bright red spotting and cramping, had an u/s done with was suspicious for failed pregnancy based on the 6wk embryo seen without cardiac activity. Since she left, the cramping has gotten worse and she has started having heavier bleeding including some small clots. Is not soaking pads. Is concerned about the increase in bleeding.  No other physical complaints.  Receives care at [facility]. Prenatal records reviewed.  Pertinent items noted in HPI and remainder of comprehensive ROS otherwise negative.   O BP 135/78 (BP Location: Right Arm)   Pulse 86   Temp 98.2 F (36.8 C) (Oral)   Resp 16   LMP 03/30/2021   SpO2 100% Comment: room air Physical Exam Vitals and nursing note reviewed.  Constitutional:      General: She is not in acute distress.    Appearance: She is well-developed. She is not ill-appearing.  Eyes:     Pupils: Pupils are equal, round, and reactive to light.  Cardiovascular:     Rate and Rhythm: Normal rate and regular rhythm.  Pulmonary:     Effort: Pulmonary effort is normal.  Musculoskeletal:        General: Normal range of motion.  Skin:    General: Skin is warm.     Capillary Refill: Capillary refill takes less than 2 seconds.  Neurological:     Mental Status: She is alert and oriented to person, place, and time.  Psychiatric:        Mood and Affect: Mood normal.        Behavior: Behavior normal.        Thought Content: Thought content normal.        Judgment: Judgment normal.   Results for orders placed or performed during the hospital encounter of 06/04/21 (from the past 24 hour(s))  hCG, quantitative, pregnancy     Status: Abnormal   Collection Time: 06/04/21  2:46 PM  Result Value Ref  Range   hCG, Beta Chain, Quant, S 13,056 (H) <5 mIU/mL   MDM Triaged patient and reviewed possible findings with HCG today. Pt to go home and be called with results.  Called patient with results, expressed condolences and gave reassurance. Discussed confirmation of miscarriage and expected progression of bleeding/cramping as well as when to seek additional care in MAU. Otherwise needs SAB follow up at [facility] in one week and two weeks. Pt aware, very tearful but understanding.  A Miscarriage in first trimester Medical screening exam complete  P Discharge from MAU in stable condition with return precautions. Follow up at [facility] in one week and two weeks  - message sent to office for scheduling assistance  Bernerd Limbo, CNM 06/04/2021 3:22 PM

## 2021-06-04 NOTE — MAU Note (Signed)
Pt okay to leave and CNM will call her with results. Pt to remain on MAU census to follow results

## 2021-06-05 ENCOUNTER — Other Ambulatory Visit: Payer: Self-pay

## 2021-06-05 ENCOUNTER — Emergency Department (HOSPITAL_COMMUNITY)
Admission: EM | Admit: 2021-06-05 | Discharge: 2021-06-05 | Disposition: A | Discharge status: Home / Self Care | Payer: No Typology Code available for payment source | Attending: Emergency Medicine | Admitting: Emergency Medicine

## 2021-06-05 DIAGNOSIS — E109 Type 1 diabetes mellitus without complications: Secondary | ICD-10-CM | POA: Diagnosis not present

## 2021-06-05 DIAGNOSIS — O209 Hemorrhage in early pregnancy, unspecified: Secondary | ICD-10-CM | POA: Diagnosis present

## 2021-06-05 DIAGNOSIS — O039 Complete or unspecified spontaneous abortion without complication: Secondary | ICD-10-CM | POA: Diagnosis not present

## 2021-06-05 LAB — CBG MONITORING, ED: Glucose-Capillary: 86 mg/dL (ref 70–99)

## 2021-06-05 MED ORDER — MISOPROSTOL 100 MCG PO TABS
800.0000 ug | ORAL_TABLET | Freq: Once | ORAL | Status: AC
  Administered 2021-06-05: 20:00:00 800 ug via ORAL
  Filled 2021-06-05: qty 8

## 2021-06-05 MED ORDER — MORPHINE SULFATE (PF) 4 MG/ML IV SOLN
4.0000 mg | Freq: Once | INTRAVENOUS | Status: DC
Start: 2021-06-05 — End: 2021-06-05

## 2021-06-05 MED ORDER — OXYCODONE-ACETAMINOPHEN 5-325 MG PO TABS: 1.0000 | ORAL_TABLET | Freq: Four times a day (QID) | ORAL | 0 refills | Status: DC | PRN

## 2021-06-05 MED ORDER — ONDANSETRON HCL 4 MG/2ML IJ SOLN
4.0000 mg | Freq: Once | INTRAMUSCULAR | Status: AC
  Administered 2021-06-05: 19:00:00 4 mg via INTRAVENOUS
  Filled 2021-06-05: qty 2

## 2021-06-05 MED ORDER — SODIUM CHLORIDE 0.9 % IV BOLUS
1000.0000 mL | Freq: Once | INTRAVENOUS | Status: AC
  Administered 2021-06-05: 19:00:00 1000 mL via INTRAVENOUS

## 2021-06-05 MED ORDER — KETOROLAC TROMETHAMINE 30 MG/ML IJ SOLN
15.0000 mg | Freq: Once | INTRAMUSCULAR | Status: AC
  Administered 2021-06-05: 19:00:00 15 mg via INTRAVENOUS
  Filled 2021-06-05: qty 1

## 2021-06-05 MED ORDER — MORPHINE SULFATE (PF) 4 MG/ML IV SOLN
4.0000 mg | Freq: Once | INTRAVENOUS | Status: AC
Start: 2021-06-05 — End: 2021-06-05
  Administered 2021-06-05: 19:00:00 4 mg via INTRAVENOUS
  Filled 2021-06-05: qty 1

## 2021-06-05 MED ORDER — ONDANSETRON HCL 4 MG/2ML IJ SOLN: 4.0000 mg | Freq: Once | INTRAMUSCULAR | Status: DC

## 2021-06-05 NOTE — Discharge Instructions (Addendum)
You have been prescribed a stronger pain medicine to take as needed for symptom relief.  Do not drive within 4 hours of taking this medication as it will make you drowsy.  Call Dr. Forestine Chute office for an office visit the end of this week, sooner if you continue to have problems with pain or any new symptoms.  Oxycodone contains Tylenol so do not take any additional Tylenol, however you may use ibuprofen and you may find that ibuprofen starts to become more effective for you as your symptoms improve.

## 2021-06-05 NOTE — ED Provider Notes (Signed)
[facility] EMERGENCY DEPARTMENT Provider Note   CSN: [id number] Arrival date & time: 06/05/21  1704     History Chief Complaint  Patient presents with   Vaginal Bleeding    Joan Gonzalez is a 25 y.o. female with history of type 1 diabetes who is [redacted] weeks pregnant, unfortunately she is in the process of undergoing a spontaneous miscarriage presenting for evaluation of pelvic cramping which has become severe this evening.  She was seen at our MAU 2 days ago for vaginal bleeding at which time she had an ultrasound and other lab tests including an hCG, followed by a repeat hCG yesterday which had declined by half confirming the spontaneous miscarriage.  She describes vaginal bleeding similar to her period, not excessively heavy, has passed a few small clots.  She mostly sees blood when she is on the toilet, has only had to change her pad several times today.  She does endorse feeling weak however.  She has taken Tylenol which did not help her pain, switch to ibuprofen which helped for the first 1 or 2 doses and is no longer helping.  She denies dysuria, fevers or chills, nausea or vomiting, denies upper abdominal pain, her pain is localized to her suprapubic and right lower pelvic region.  The history is provided by the patient.      Past Medical History:  Diagnosis Date   Diabetes mellitus without complication [facility])     Patient Active Problem List   Diagnosis Date Noted   Type 1 diabetes mellitus without complication (HCC) 11/11/2019   Patient desires pregnancy 09/30/2019   Anxiety and depression 09/30/2019   Type 1 diabetes mellitus (HCC) 11/16/2015    Past Surgical History:  Procedure Laterality Date   dental implants     DENTAL RESTORATION/EXTRACTION WITH X-RAY     TYMPANOSTOMY TUBE PLACEMENT       OB History     Gravida  1   Para  0   Term  0   Preterm  0   AB  0   Living  0      SAB  0   IAB  0   Ectopic  0   Multiple  0   Live Births  0            Family History  Problem Relation Age of Onset   Asthma Sister    Seizures Sister    Cancer Paternal Grandmother        breast   COPD Paternal Grandfather     Social History   Tobacco Use   Smoking status: Never   Smokeless tobacco: Never  Vaping Use   Vaping Use: Never used  Substance Use Topics   Alcohol use: No    Alcohol/week: 0.0 standard drinks   Drug use: No    Home Medications Prior to Admission medications   Medication Sig Start Date End Date Taking? Authorizing Provider  acetaminophen (TYLENOL) 500 MG tablet Take 500 mg by mouth every 6 (six) hours as needed.   Yes [provider]  ibuprofen (ADVIL) 200 MG tablet Take 400 mg by mouth every 6 (six) hours as needed.   Yes [provider]  insulin lispro (HUMALOG) 100 UNIT/ML injection Inject into the skin 3 (three) times daily before meals.   Yes [provider]  oxyCODONE-acetaminophen (PERCOCET/ROXICET) 5-325 MG tablet Take 1-2 tablets by mouth every 6 (six) hours as needed for severe pain. 06/05/21  Yes Burgess Amor, PA-C  Continuous  Blood Gluc Sensor (DEXCOM G6 SENSOR) MISC See admin instructions. 06/04/19   [provider]  Continuous Blood Gluc Transmit (DEXCOM G6 TRANSMITTER) MISC See admin instructions. 06/04/19   [provider]  Insulin Human (INSULIN PUMP) SOLN Inject into the skin.    [provider]  moxifloxacin (VIGAMOX) 0.5 % ophthalmic solution Place 1 drop into the right eye 3 (three) times daily. X 7 days Patient not taking: Reported on 06/05/2021 04/24/21   Domenick Gong, MD  oseltamivir (TAMIFLU) 75 MG capsule Take 1 capsule (75 mg total) by mouth 2 (two) times daily for 5 days. Patient not taking: Reported on 06/05/2021 05/31/21 06/05/21  Tommie Sams, DO    Allergies    Other  Review of Systems   Review of Systems  Constitutional:  Positive for fatigue. Negative for chills and fever.  HENT:  Negative for congestion.   Eyes:  Negative.   Respiratory:  Negative for chest tightness and shortness of breath.   Cardiovascular:  Negative for chest pain.  Gastrointestinal:  Negative for abdominal pain, nausea and vomiting.  Genitourinary:  Positive for pelvic pain and vaginal bleeding.  Musculoskeletal:  Negative for arthralgias, joint swelling and neck pain.  Skin: Negative.  Negative for rash and wound.  Neurological:  Negative for dizziness, weakness, light-headedness, numbness and headaches.  Psychiatric/Behavioral: Negative.    All other systems reviewed and are negative.  Physical Exam Updated Vital Signs BP 128/70   Pulse 86   Temp 98.1 F (36.7 C) (Oral)   Resp 19   Ht 5\' 2"  (1.575 m)   Wt 68.9 kg   LMP 03/30/2021   SpO2 100%   BMI 27.80 kg/m   Physical Exam Vitals and nursing note reviewed.  Constitutional:      Appearance: She is well-developed.  HENT:     Head: Normocephalic and atraumatic.  Eyes:     Conjunctiva/sclera: Conjunctivae normal.  Cardiovascular:     Rate and Rhythm: Normal rate and regular rhythm.     Heart sounds: Normal heart sounds.  Pulmonary:     Effort: Pulmonary effort is normal.     Breath sounds: Normal breath sounds. No wheezing.  Abdominal:     General: Bowel sounds are normal.     Palpations: Abdomen is soft.     Tenderness: There is abdominal tenderness in the suprapubic area. There is no guarding or rebound.  Musculoskeletal:        General: Normal range of motion.     Cervical back: Normal range of motion.  Skin:    General: Skin is warm and dry.  Neurological:     Mental Status: She is alert.    ED Results / Procedures / Treatments   Labs (all labs ordered are listed, but only abnormal results are displayed) Labs Reviewed  CBG MONITORING, ED    EKG None  Radiology No results found.  Procedures Procedures   Medications Ordered in ED Medications  misoprostol (CYTOTEC) tablet 800 mcg (has no administration in time range)  sodium chloride  0.9 % bolus 1,000 mL (1,000 mLs Intravenous New Bag/Given 06/05/21 1831)  morphine 4 MG/ML injection 4 mg (4 mg Intravenous Given 06/05/21 1832)  ondansetron (ZOFRAN) injection 4 mg (4 mg Intravenous Given 06/05/21 1831)  ketorolac (TORADOL) 30 MG/ML injection 15 mg (15 mg Intravenous Given 06/05/21 1832)    ED Course  I have reviewed the triage vital signs and the nursing notes.  Pertinent labs & imaging results that were available during  my care of the patient were reviewed by me and considered in my medical decision making (see chart for details).    MDM Rules/Calculators/A&P                           Prior visit at MAU reviewed including labs and subsequent hCG from yesterday.Her hCG was 22,678 2 days ago, yesterday it was 13,056.  Ultrasound performed on 1125 showed an intrauterine gestational sac but without cardiac activity measuring 6 weeks 3 days.  Pt's MAU evaluation and todays sx discussed with Dr Despina Hidden who advised pain control, would recommend percocet for home, Toradol IM here and offer cytotec 800 mcg. Buccal dosing.  Buccal dosing not available here - will give 800 mcg oral with snack.  Patient is a diabetic, will check a quick CBG to make sure she is stable with her blood glucose levels.  Pt should follow up with [facility] end of this week for f/u care.    We will plan discharge home once her symptoms are improved.  She otherwise appears stable.   Final Clinical Impression(s) / ED Diagnoses Final diagnoses:  Miscarriage    Rx / DC Orders ED Discharge Orders          Ordered    oxyCODONE-acetaminophen (PERCOCET/ROXICET) 5-325 MG tablet  Every 6 hours PRN        06/05/21 1850             Victoriano Lain 06/05/21 1854    Jacalyn Lefevre, MD 06/05/21 1926

## 2021-06-05 NOTE — ED Triage Notes (Signed)
Pt states "I am having a miscarriage and the pain is really severe". Vaginal bleeding started Friday.

## 2021-06-05 NOTE — ED Notes (Signed)
Patient left ED with ABCs intact, alert and oriented x4, respirations even and unlabored. Discharge instructions reviewed and all questions answered.   

## 2021-06-05 NOTE — ED Provider Notes (Signed)
   Patient signed out to me by Burgess Amor, PA-C pending reevaluation.  Patient here with complaints of pelvic cramping and currently undergoing a spontaneous miscarriage.  She was given pain medication, IV fluids, and Cytotec here after consultation with OB/GYN.  Patient has been observed in the department.  After completion of IV fluids and pain medication, patient now resting comfortably stating that she is feeling better than when she arrived.  Pain is now minimal.  CBG here is 86.  She states she is having only intermittent "twinges" of pain to her lower abdomen.  Since pharmacies are closed at this time, will dispense prepack dosing of oxycodone for home use this evening.  Patient will arrange close follow-up with OB/GYN.  All questions were answered.  She appears appropriate for discharge home.  Return precautions were discussed.   Pauline Aus, PA-C 06/05/21 2119    Jacalyn Lefevre, MD 06/05/21 2135

## 2021-06-05 NOTE — ED Triage Notes (Signed)
Pt was seen at [facility] [place] in [place] and it was confirmed she was having a miscarriage.

## 2021-06-06 ENCOUNTER — Encounter: Payer: Self-pay | Admitting: *Deleted

## 2021-06-06 ENCOUNTER — Telehealth: Payer: Self-pay | Admitting: Obstetrics & Gynecology

## 2021-06-06 MED FILL — Oxycodone w/ Acetaminophen Tab 5-325 MG: ORAL | Qty: 6 | Status: AC

## 2021-06-06 NOTE — Telephone Encounter (Signed)
Patient called to see about getting a note for her visit to [facility] with [facility] on 06/04/21 for miscarriage.

## 2021-06-06 NOTE — Telephone Encounter (Signed)
Letter given to remain out of work until 06/13/21. Pt will let us know if she needs anything else.

## 2021-06-07 ENCOUNTER — Ambulatory Visit: Payer: Self-pay | Admitting: Nutrition

## 2021-06-09 ENCOUNTER — Other Ambulatory Visit: Payer: No Typology Code available for payment source

## 2021-06-15 ENCOUNTER — Other Ambulatory Visit: Payer: No Typology Code available for payment source

## 2021-06-22 ENCOUNTER — Other Ambulatory Visit (INDEPENDENT_AMBULATORY_CARE_PROVIDER_SITE_OTHER): Payer: No Typology Code available for payment source

## 2021-06-22 ENCOUNTER — Other Ambulatory Visit: Payer: Self-pay

## 2021-06-22 ENCOUNTER — Other Ambulatory Visit (HOSPITAL_COMMUNITY)
Admission: RE | Admit: 2021-06-22 | Discharge: 2021-06-22 | Disposition: A | Discharge status: Home / Self Care | Payer: No Typology Code available for payment source | Source: Ambulatory Visit | Attending: Obstetrics & Gynecology | Admitting: Obstetrics & Gynecology

## 2021-06-22 DIAGNOSIS — N898 Other specified noninflammatory disorders of vagina: Secondary | ICD-10-CM

## 2021-06-22 DIAGNOSIS — R399 Unspecified symptoms and signs involving the genitourinary system: Secondary | ICD-10-CM

## 2021-06-22 LAB — POCT URINALYSIS DIPSTICK OB
Glucose, UA: NEGATIVE
Ketones, UA: NEGATIVE
Nitrite, UA: POSITIVE
POC,PROTEIN,UA: NEGATIVE

## 2021-06-22 MED ORDER — SULFAMETHOXAZOLE-TRIMETHOPRIM 800-160 MG PO TABS: 1.0000 | ORAL_TABLET | Freq: Two times a day (BID) | ORAL | 0 refills | Status: DC

## 2021-06-22 NOTE — Addendum Note (Signed)
Addended by: Cyril Mourning A on: 06/22/2021 04:32 PM   Modules accepted: Orders

## 2021-06-22 NOTE — Progress Notes (Signed)
° °  NURSE VISIT- UTI SYMPTOMS   SUBJECTIVE:  Joan Gonzalez is a 25 y.o. G4P0000 female here for UTI symptoms. She is a GYN patient. She reports hematuria, urinary frequency, urinary urgency, and odor .  OBJECTIVE:  LMP 03/30/2021   Appears well, in no apparent distress  Results for orders placed or performed in visit on 06/22/21 (from the past 24 hour(s))  POC Urinalysis Dipstick OB   Collection Time: 06/22/21  3:48 PM  Result Value Ref Range   Color, UA     Clarity, UA     Glucose, UA Negative Negative   Bilirubin, UA     Ketones, UA neg    Spec Grav, UA     Blood, UA small    pH, UA     POC,PROTEIN,UA Negative Negative, Trace, Small (1+), Moderate (2+), Large (3+), 4+   Urobilinogen, UA     Nitrite, UA pos    Leukocytes, UA Moderate (2+) (A) Negative   Appearance     Odor      ASSESSMENT: GYN patient with UTI symptoms and positive nitrites  PLAN: Discussed with Cyril Mourning, AGNP   Rx sent by provider today: Yes Urine culture sent Call or return to clinic prn if these symptoms worsen or fail to improve as anticipated. Follow-up: as scheduled     NURSE VISIT- VAGINITIS/STD  SUBJECTIVE:  Joan Gonzalez is a 25 y.o. G1P0000 GYN patientfemale here for a vaginal swab for vaginitis screening, STD screen.  She reports the following symptoms: discharge described as white for 3 days. Denies abnormal vaginal bleeding, significant pelvic pain, fever, or UTI symptoms.  OBJECTIVE:  LMP 03/30/2021   Appears well, in no apparent distress  ASSESSMENT: Vaginal swab for  vaginitis & STD screening  PLAN: Self-collected vaginal probe for Gonorrhea, Chlamydia, Trichomonas, Bacterial Vaginosis, Yeast sent to lab Treatment: to be determined once results are received Follow-up as needed if symptoms persist/worsen, or new symptoms develop  Sherry Blackard A Kaleena Corrow  06/22/2021 3:57 PM

## 2021-06-22 NOTE — Progress Notes (Signed)
Chart reviewed for nurse visit. Agree with plan of care. Rx sent fo septra ds  Adline Potter, NP 06/22/2021 4:31 PM

## 2021-06-23 LAB — MICROSCOPIC EXAMINATION
Casts: NONE SEEN /lpf
RBC, Urine: NONE SEEN /hpf (ref 0–2)

## 2021-06-23 LAB — URINALYSIS, ROUTINE W REFLEX MICROSCOPIC
Bilirubin, UA: NEGATIVE
Glucose, UA: NEGATIVE
Ketones, UA: NEGATIVE
Nitrite, UA: NEGATIVE
Protein,UA: NEGATIVE
RBC, UA: NEGATIVE
Specific Gravity, UA: 1.008 (ref 1.005–1.030)
Urobilinogen, Ur: 0.2 mg/dL (ref 0.2–1.0)
pH, UA: 7 (ref 5.0–7.5)

## 2021-06-24 LAB — CERVICOVAGINAL ANCILLARY ONLY
Bacterial Vaginitis (gardnerella): NEGATIVE
Candida Glabrata: NEGATIVE
Candida Vaginitis: NEGATIVE
Chlamydia: NEGATIVE
Comment: NEGATIVE
Comment: NEGATIVE
Comment: NEGATIVE
Comment: NEGATIVE
Comment: NEGATIVE
Comment: NORMAL
Neisseria Gonorrhea: NEGATIVE
Trichomonas: NEGATIVE

## 2021-06-29 LAB — URINE CULTURE

## 2021-06-30 ENCOUNTER — Ambulatory Visit (INDEPENDENT_AMBULATORY_CARE_PROVIDER_SITE_OTHER): Payer: No Typology Code available for payment source | Admitting: Obstetrics & Gynecology

## 2021-06-30 ENCOUNTER — Encounter: Payer: Self-pay | Admitting: Obstetrics & Gynecology

## 2021-06-30 ENCOUNTER — Other Ambulatory Visit: Payer: Self-pay

## 2021-06-30 VITALS — BP 124/79 | HR 90 | Ht 62.0 in | Wt 157.0 lb

## 2021-06-30 DIAGNOSIS — E109 Type 1 diabetes mellitus without complications: Secondary | ICD-10-CM

## 2021-06-30 DIAGNOSIS — Z7189 Other specified counseling: Secondary | ICD-10-CM | POA: Diagnosis not present

## 2021-06-30 DIAGNOSIS — O039 Complete or unspecified spontaneous abortion without complication: Secondary | ICD-10-CM | POA: Diagnosis not present

## 2021-06-30 MED ORDER — DROSPIRENONE-ESTETROL 3-14.2 MG PO TABS: 1.0000 | ORAL_TABLET | Freq: Every day | ORAL | 12 refills | Status: DC

## 2021-06-30 NOTE — Progress Notes (Signed)
Chief Complaint  Patient presents with   Follow-up    miscarriage      25 y.o. G1P0000 No LMP recorded. The current method of family planning is none.  Outpatient Encounter Medications as of 06/30/2021  Medication Sig   Continuous Blood Gluc Sensor (DEXCOM G6 SENSOR) MISC See admin instructions.   Continuous Blood Gluc Transmit (DEXCOM G6 TRANSMITTER) MISC See admin instructions.   Drospirenone-Estetrol 3-14.2 MG TABS Take 1 tablet by mouth daily.   Insulin Human (INSULIN PUMP) SOLN Inject into the skin.   insulin lispro (HUMALOG) 100 UNIT/ML injection Inject into the skin 3 (three) times daily before meals.   acetaminophen (TYLENOL) 500 MG tablet Take 500 mg by mouth every 6 (six) hours as needed. (Patient not taking: Reported on 06/30/2021)   ibuprofen (ADVIL) 200 MG tablet Take 400 mg by mouth every 6 (six) hours as needed. (Patient not taking: Reported on 06/30/2021)   moxifloxacin (VIGAMOX) 0.5 % ophthalmic solution Place 1 drop into the right eye 3 (three) times daily. X 7 days (Patient not taking: Reported on 06/05/2021)   [DISCONTINUED] oxyCODONE-acetaminophen (PERCOCET/ROXICET) 5-325 MG tablet Take 1-2 tablets by mouth every 6 (six) hours as needed for severe pain. (Patient not taking: Reported on 06/30/2021)   [DISCONTINUED] oxyCODONE-acetaminophen (PERCOCET/ROXICET) 5-325 MG tablet Take 1 tablet by mouth every 6 (six) hours as needed for severe pain. (Patient not taking: Reported on 06/30/2021)   [DISCONTINUED] sulfamethoxazole-trimethoprim (BACTRIM DS) 800-160 MG tablet Take 1 tablet by mouth 2 (two) times daily. Take 1 bid (Patient not taking: Reported on 06/30/2021)   No facility-administered encounter medications on file as of 06/30/2021.    Subjective Early pregnancy loss, no embryonic pole or YS seen History consistent with completed Past Medical History:  Diagnosis Date   Diabetes mellitus without complication (HCC)     Past Surgical History:  Procedure  Laterality Date   dental implants     DENTAL RESTORATION/EXTRACTION WITH X-RAY     TYMPANOSTOMY TUBE PLACEMENT      OB History     Gravida  2   Para  0   Term  0   Preterm  0   AB  1   Living  0      SAB  0   IAB  0   Ectopic  0   Multiple  0   Live Births  0           Allergies  Allergen Reactions   Other Rash    Adhesive tape    Social History   Socioeconomic History   Marital status: Married    Spouse name: Not on file   Number of children: Not on file   Years of education: Not on file   Highest education level: Not on file  Occupational History   Not on file  Tobacco Use   Smoking status: Never   Smokeless tobacco: Never  Vaping Use   Vaping Use: Never used  Substance and Sexual Activity   Alcohol use: No    Alcohol/week: 0.0 standard drinks   Drug use: No   Sexual activity: Yes    Birth control/protection: None, Condom  Other Topics Concern   Not on file  Social History Narrative   Not on file   Social Determinants of Health   Financial Resource Strain: Not on file  Food Insecurity: Not on file  Transportation Needs: Not on file  Physical Activity: Not on file  Stress: Not on file  Social Connections: Not on file    Family History  Problem Relation Age of Onset   Asthma Sister    Seizures Sister    Cancer Paternal Grandmother        breast   COPD Paternal Grandfather     Medications:       Current Outpatient Medications:    Continuous Blood Gluc Sensor (DEXCOM G6 SENSOR) MISC, See admin instructions., Disp: , Rfl:    Continuous Blood Gluc Transmit (DEXCOM G6 TRANSMITTER) MISC, See admin instructions., Disp: , Rfl:    Drospirenone-Estetrol 3-14.2 MG TABS, Take 1 tablet by mouth daily., Disp: 28 tablet, Rfl: 12   Insulin Human (INSULIN PUMP) SOLN, Inject into the skin., Disp: , Rfl:    insulin lispro (HUMALOG) 100 UNIT/ML injection, Inject into the skin 3 (three) times daily before meals., Disp: , Rfl:    acetaminophen  (TYLENOL) 500 MG tablet, Take 500 mg by mouth every 6 (six) hours as needed. (Patient not taking: Reported on 06/30/2021), Disp: , Rfl:    ibuprofen (ADVIL) 200 MG tablet, Take 400 mg by mouth every 6 (six) hours as needed. (Patient not taking: Reported on 06/30/2021), Disp: , Rfl:    moxifloxacin (VIGAMOX) 0.5 % ophthalmic solution, Place 1 drop into the right eye 3 (three) times daily. X 7 days (Patient not taking: Reported on 06/05/2021), Disp: 3 mL, Rfl: 0  Objective Blood pressure 124/79, pulse 90, height 5\' 2"  (1.575 m), weight 157 lb (71.2 kg), unknown if currently breastfeeding.  Gen WDWN NAD  Pertinent ROS No burning with urination, frequency or urgency No nausea, vomiting or diarrhea Nor fever chills or other constitutional symptoms   Labs or studies Reviewed labs and sonograms    Impression Diagnoses this Encounter::   ICD-10-CM   1. Complete miscarriage, early 1st trimester  O03.9     2. Prenatal consult  Z71.89       Established relevant diagnosis(es):   Plan/Recommendations: Meds ordered this encounter  Medications   Drospirenone-Estetrol 3-14.2 MG TABS    Sig: Take 1 tablet by mouth daily.    Dispense:  28 tablet    Refill:  12    Labs or Scans Ordered: No orders of the defined types were placed in this encounter.   Management:: Begin COC if desires, will discuss with husband Send me MyChart message if needed for any future questions  Follow up Return if symptoms worsen or fail to improve.      All questions were answered.

## 2021-08-29 ENCOUNTER — Telehealth: Payer: Self-pay | Admitting: Family Medicine

## 2021-08-29 DIAGNOSIS — Z319 Encounter for procreative management, unspecified: Secondary | ICD-10-CM

## 2021-08-29 NOTE — Telephone Encounter (Signed)
Please go ahead with referral to this doctor specifically thank you

## 2021-08-29 NOTE — Telephone Encounter (Signed)
Patient would like to see Dr Lynford Citizen high risk OBGYN at [facility]- Patient type one diabetic with recent miscarriage- has had one cycle since miscarriage -cycle was 08/06/21

## 2021-08-29 NOTE — Telephone Encounter (Signed)
Patient is requesting a referral to [facility] doctor in [place] that deal with high risk pregnancy.

## 2021-08-29 NOTE — Telephone Encounter (Signed)
Referral ordered in EPIC. 

## 2022-01-19 ENCOUNTER — Ambulatory Visit (INDEPENDENT_AMBULATORY_CARE_PROVIDER_SITE_OTHER): Payer: Self-pay | Admitting: Obstetrics & Gynecology

## 2022-01-19 VITALS — BP 119/78 | HR 77 | Ht 62.0 in | Wt 163.0 lb

## 2022-01-19 DIAGNOSIS — L739 Follicular disorder, unspecified: Secondary | ICD-10-CM

## 2022-01-19 MED ORDER — SILVER SULFADIAZINE 1 % EX CREA: TOPICAL_CREAM | CUTANEOUS | 11 refills | Status: DC

## 2022-01-19 NOTE — Progress Notes (Signed)
Chief Complaint  Patient presents with   Gynecologic Exam      26 y.o. G2P0010 Patient's last menstrual period was 01/07/2022. The current method of family planning is none.  Outpatient Encounter Medications as of 01/19/2022  Medication Sig   Continuous Blood Gluc Sensor (DEXCOM G6 SENSOR) MISC See admin instructions.   Continuous Blood Gluc Transmit (DEXCOM G6 TRANSMITTER) MISC See admin instructions.   Insulin Human (INSULIN PUMP) SOLN Inject into the skin.   insulin lispro (HUMALOG) 100 UNIT/ML injection Inject into the skin 3 (three) times daily before meals.   silver sulfADIAZINE (SILVADENE) 1 % cream Use to area 3 times per day   acetaminophen (TYLENOL) 500 MG tablet Take 500 mg by mouth every 6 (six) hours as needed. (Patient not taking: Reported on 06/30/2021)   Drospirenone-Estetrol 3-14.2 MG TABS Take 1 tablet by mouth daily. (Patient not taking: Reported on 01/19/2022)   ibuprofen (ADVIL) 200 MG tablet Take 400 mg by mouth every 6 (six) hours as needed. (Patient not taking: Reported on 06/30/2021)   moxifloxacin (VIGAMOX) 0.5 % ophthalmic solution Place 1 drop into the right eye 3 (three) times daily. X 7 days (Patient not taking: Reported on 06/05/2021)   No facility-administered encounter medications on file as of 01/19/2022.    Subjective Area on the left vulva for 2-3 weeks Just wouldn't go away Was tender but not now Never had similar Past Medical History:  Diagnosis Date   Diabetes mellitus without complication (HCC)     Past Surgical History:  Procedure Laterality Date   dental implants     DENTAL RESTORATION/EXTRACTION WITH X-RAY     TYMPANOSTOMY TUBE PLACEMENT      OB History     Gravida  2   Para  0   Term  0   Preterm  0   AB  1   Living  0      SAB  0   IAB  0   Ectopic  0   Multiple  0   Live Births  0           Allergies  Allergen Reactions   Other Rash    Adhesive tape    Social History   Socioeconomic  History   Marital status: Married    Spouse name: Not on file   Number of children: Not on file   Years of education: Not on file   Highest education level: Not on file  Occupational History   Not on file  Tobacco Use   Smoking status: Never   Smokeless tobacco: Never  Vaping Use   Vaping Use: Never used  Substance and Sexual Activity   Alcohol use: No    Alcohol/week: 0.0 standard drinks of alcohol   Drug use: No   Sexual activity: Yes    Birth control/protection: None, Condom  Other Topics Concern   Not on file  Social History Narrative   Not on file   Social Determinants of Health   Financial Resource Strain: Low Risk  (11/11/2019)   Overall Financial Resource Strain (CARDIA)    Difficulty of Paying Living Expenses: Not hard at all  Food Insecurity: No Food Insecurity (11/11/2019)   Hunger Vital Sign    Worried About Running Out of Food in the Last Year: Never true    Ran Out of Food in the Last Year: Never true  Transportation Needs: No Transportation Needs (11/11/2019)   PRAPARE - Transportation  Lack of Transportation (Medical): No    Lack of Transportation (Non-Medical): No  Physical Activity: Sufficiently Active (11/11/2019)   Exercise Vital Sign    Days of Exercise per Week: 4 days    Minutes of Exercise per Session: 40 min  Stress: Stress Concern Present (11/11/2019)   [employer] of Occupational Health - Occupational Stress Questionnaire    Feeling of Stress : To some extent  Social Connections: Moderately Isolated (11/11/2019)   Social Connection and Isolation Panel [NHANES]    Frequency of Communication with Friends and Family: More than three times a week    Frequency of Social Gatherings with Friends and Family: More than three times a week    Attends Religious Services: Never    Diplomatic Services operational officer: Not on file    Attends Banker Meetings: Never    Marital Status: Married    Family History  Problem Relation Age of  Onset   Asthma Sister    Seizures Sister    Cancer Paternal Grandmother        breast   COPD Paternal Grandfather     Medications:       Current Outpatient Medications:    Continuous Blood Gluc Sensor (DEXCOM G6 SENSOR) MISC, See admin instructions., Disp: , Rfl:    Continuous Blood Gluc Transmit (DEXCOM G6 TRANSMITTER) MISC, See admin instructions., Disp: , Rfl:    Insulin Human (INSULIN PUMP) SOLN, Inject into the skin., Disp: , Rfl:    insulin lispro (HUMALOG) 100 UNIT/ML injection, Inject into the skin 3 (three) times daily before meals., Disp: , Rfl:    silver sulfADIAZINE (SILVADENE) 1 % cream, Use to area 3 times per day, Disp: 50 g, Rfl: 11   acetaminophen (TYLENOL) 500 MG tablet, Take 500 mg by mouth every 6 (six) hours as needed. (Patient not taking: Reported on 06/30/2021), Disp: , Rfl:    Drospirenone-Estetrol 3-14.2 MG TABS, Take 1 tablet by mouth daily. (Patient not taking: Reported on 01/19/2022), Disp: 28 tablet, Rfl: 12   ibuprofen (ADVIL) 200 MG tablet, Take 400 mg by mouth every 6 (six) hours as needed. (Patient not taking: Reported on 06/30/2021), Disp: , Rfl:    moxifloxacin (VIGAMOX) 0.5 % ophthalmic solution, Place 1 drop into the right eye 3 (three) times daily. X 7 days (Patient not taking: Reported on 06/05/2021), Disp: 3 mL, Rfl: 0  Objective Blood pressure 119/78, pulse 77, height 5\' 2"  (1.575 m), weight 163 lb (73.9 kg), last menstrual period 01/07/2022, not currently breastfeeding.  Small folliculitis on the left(not HSV, not abscess)  Pertinent ROS No burning with urination, frequency or urgency No nausea, vomiting or diarrhea Nor fever chills or other constitutional symptoms   Labs or studies No new    Impression + Management Plan: Diagnoses this Encounter::   ICD-10-CM   1. Folliculitis  L73.9    Rx silvadene        Medications prescribed during  this encounter: Meds ordered this encounter  Medications   silver sulfADIAZINE (SILVADENE)  1 % cream    Sig: Use to area 3 times per day    Dispense:  50 g    Refill:  11    Labs or Scans Ordered during this encounter: No orders of the defined types were placed in this encounter.     Follow up Return if symptoms worsen or fail to improve.

## 2022-04-03 IMAGING — US US OB TRANSVAGINAL
1 series · 15 of 28 positions shown · non-contrast
Comparison: None.

CLINICAL DATA: Vaginal bleeding. Pregnant patient, 9 weeks and 2
days based on the last menstrual period.

EXAM:
TRANSVAGINAL OB ULTRASOUND
TECHNIQUE: Transvaginal ultrasound was performed for complete evaluation of the
gestation as well as the maternal uterus, adnexal regions, and
pelvic cul-de-sac.

[Series 1: us ob transvaginal · 15 of 43 slices shown]
[im 1/43]
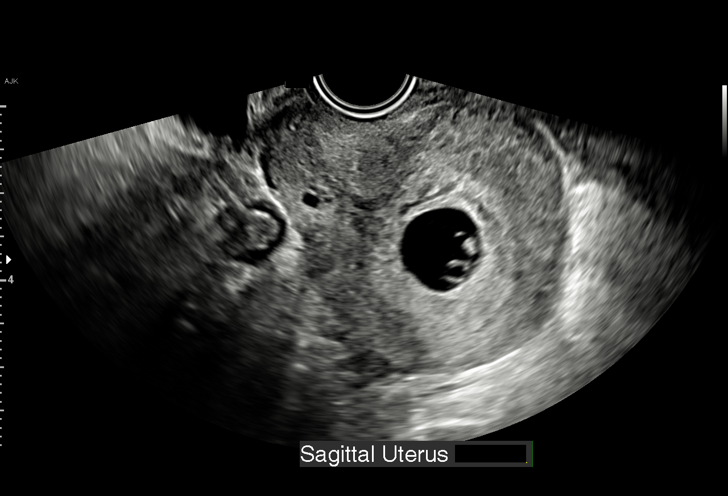
[im 4/43]
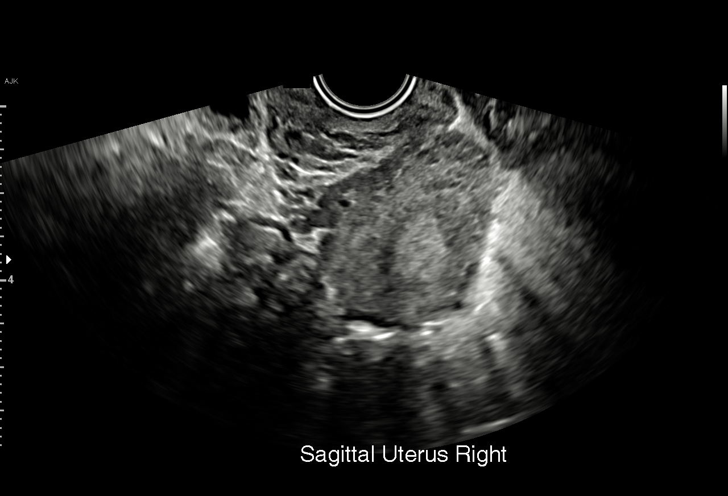
[im 7/43]
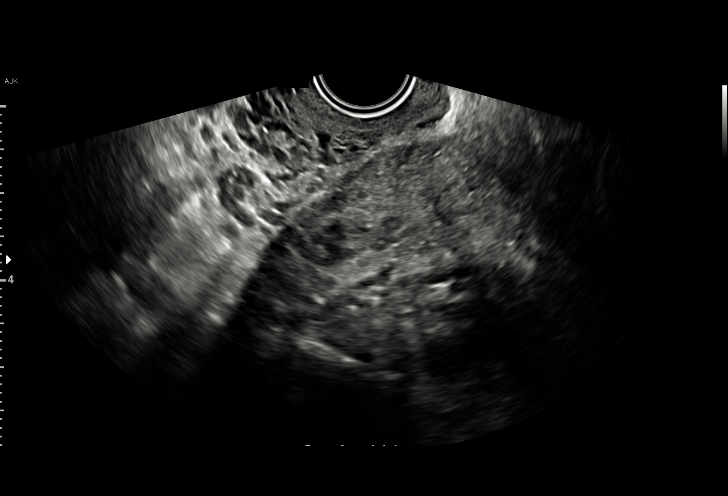
[im 10/43]
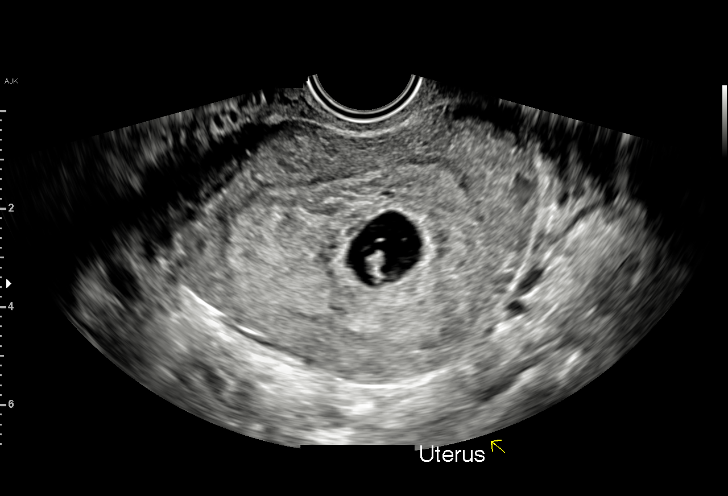
[im 13/43]
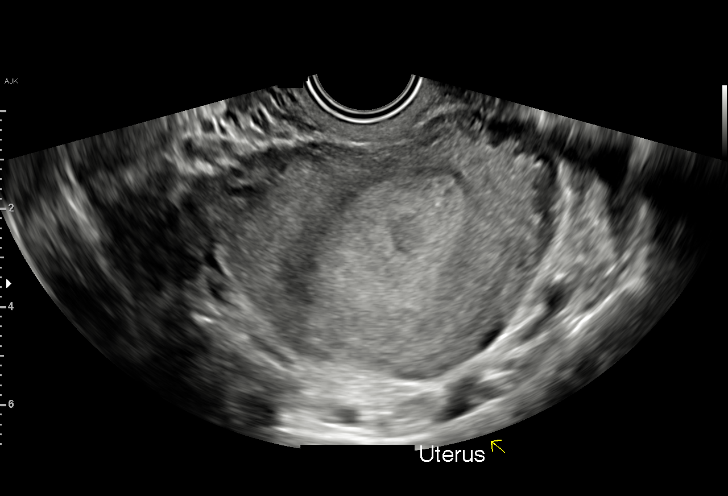
[im 16/43]
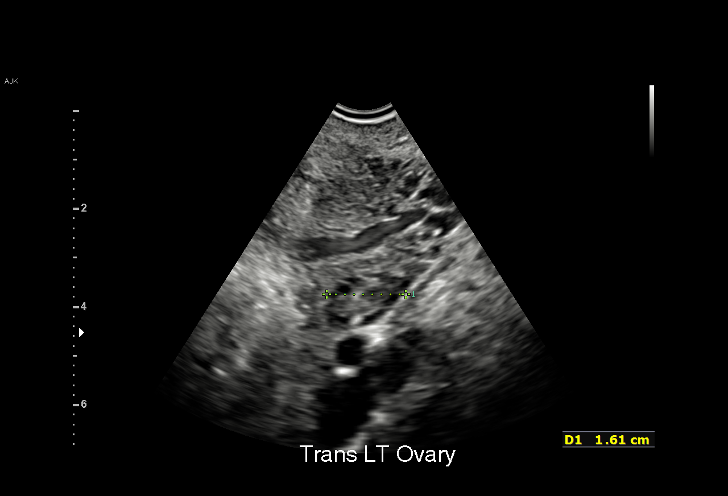
[im 19/43]
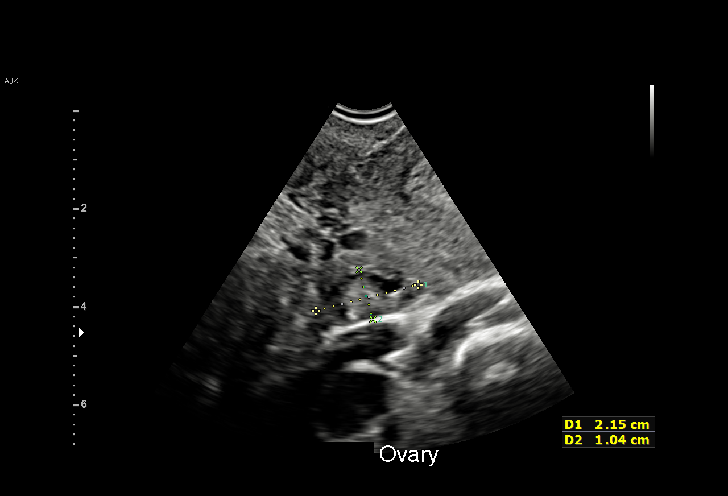
[im 22/43]
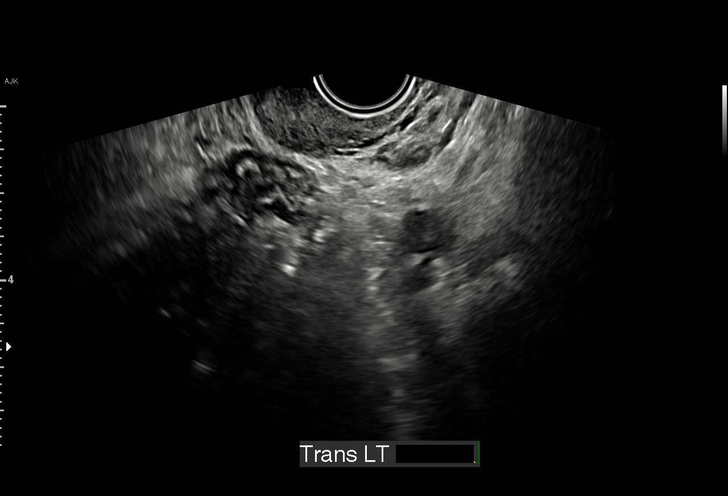
[im 24/43]
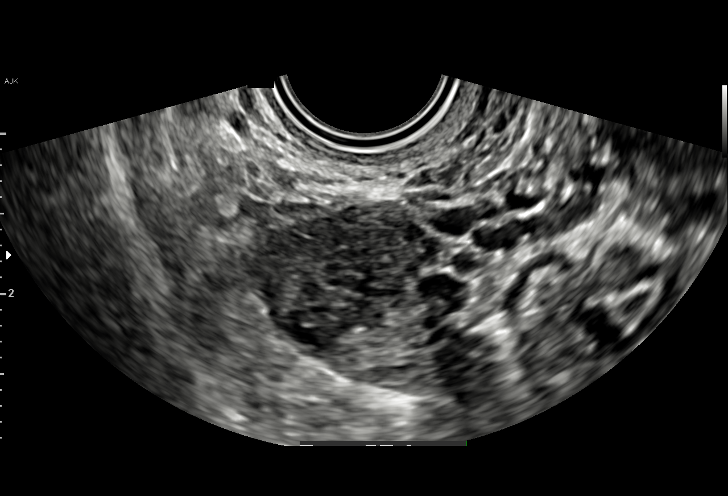
[im 27/43]
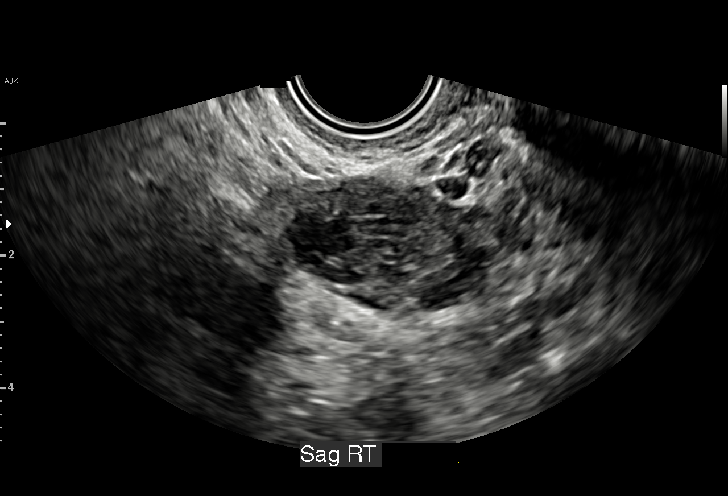
[im 30/43]
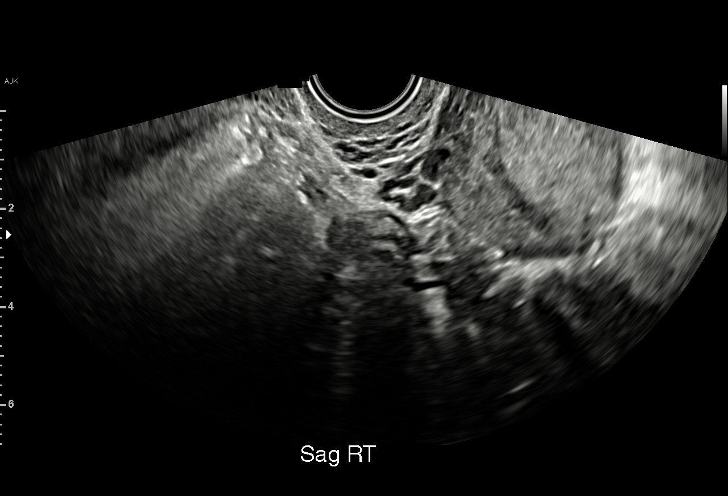
[im 33/43]
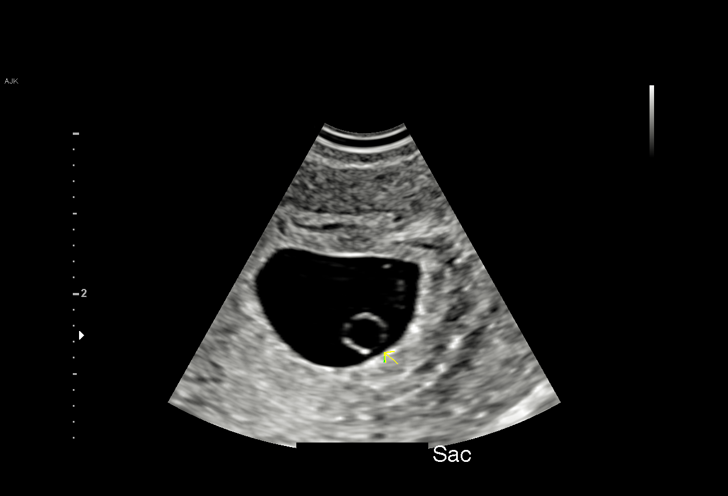
[im 36/43]
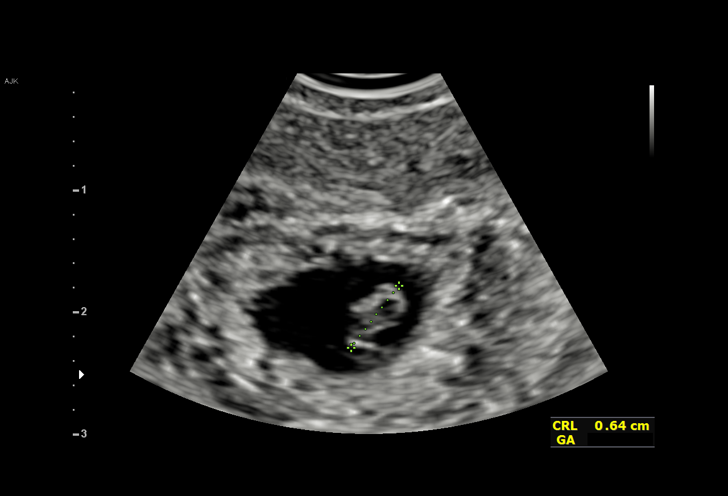
[im 39/43]
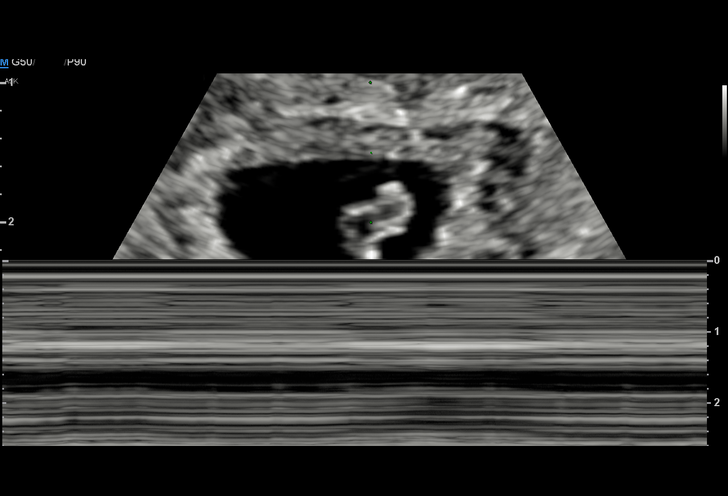
[im 43/43]
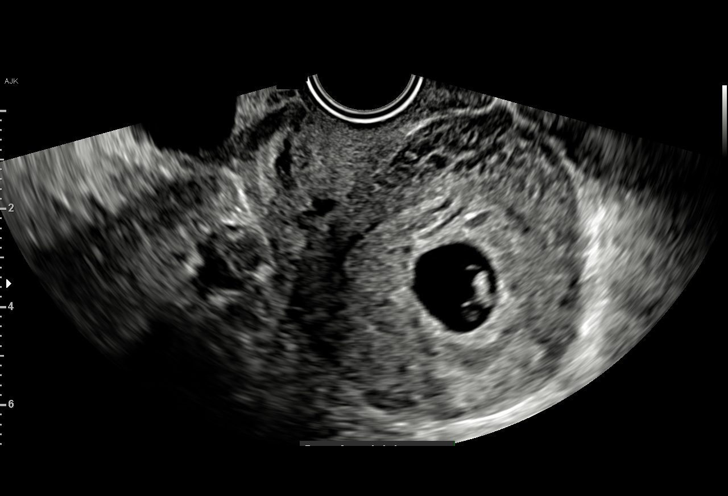

[15 of 28 positions shown; findings below may reference images not displayed]

FINDINGS: Intrauterine gestational sac: Single

Yolk sac:  Visualized.

Embryo:  Visualized.

Cardiac Activity: Not Visualized.

Heart Rate: NA

CRL:   6.5 mm   6 w 3 d                  US EDC: 01/24/2022

Subchorionic hemorrhage:  None visualized.

Maternal uterus/adnexae: No uterine masses. Cervix unremarkable.
Ovaries and adnexa within normal limits. No abnormal pelvic free
fluid.
IMPRESSION: 1. Gestational sac, yolk sac and embryo, but no visualized cardiac
activity. Findings are suspicious but not yet definitive for failed
pregnancy. Recommend follow-up US in 10-14 days for definitive
diagnosis. This recommendation follows SRU consensus guidelines:
Diagnostic Criteria for Nonviable Pregnancy Early in the First
Trimester. N Engl J Med 5770; [DATE].
2. Exam otherwise unremarkable.

## 2022-05-19 ENCOUNTER — Ambulatory Visit (INDEPENDENT_AMBULATORY_CARE_PROVIDER_SITE_OTHER): Payer: PRIVATE HEALTH INSURANCE | Admitting: Nurse Practitioner

## 2022-05-19 VITALS — BP 112/68 | HR 77 | Temp 98.5°F | Ht 62.01 in | Wt 165.8 lb

## 2022-05-19 DIAGNOSIS — E109 Type 1 diabetes mellitus without complications: Secondary | ICD-10-CM

## 2022-05-19 DIAGNOSIS — Z79899 Other long term (current) drug therapy: Secondary | ICD-10-CM | POA: Diagnosis not present

## 2022-05-19 DIAGNOSIS — Z13220 Encounter for screening for lipoid disorders: Secondary | ICD-10-CM

## 2022-05-19 DIAGNOSIS — Z01419 Encounter for gynecological examination (general) (routine) without abnormal findings: Secondary | ICD-10-CM

## 2022-05-19 DIAGNOSIS — Z23 Encounter for immunization: Secondary | ICD-10-CM | POA: Diagnosis not present

## 2022-05-19 MED ORDER — ESCITALOPRAM OXALATE 10 MG PO TABS: 10.0000 mg | ORAL_TABLET | Freq: Every day | ORAL | 0 refills | Status: DC

## 2022-05-19 NOTE — Progress Notes (Unsigned)
Subjective:    Patient ID: Joan Gonzalez, female    DOB: 09/13/1995, 26 y.o.   MRN: [id number]  HPI:  A review of their health history was completed.  A review of medications was also completed.  Any needed refills; No  Eating habits: Yes, trying to eat healthy  Falls/  MVA accidents in past few months: No  Regular exercise: No  Specialist pt sees on regular basis: Endocrinologist  Dr. Buddy Duty  Preventative health issues were discussed.   Additional concerns: Yes  HPI: Presents today for Annual PE/Well-woman visit. Noticing mood swings. 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 Denies panick attacks or palpitations. Denies thoughts of self-harm or harm of others. Miscarriage one year ago. Expressed ambivalence about future pregnancies. Sleeping 6-8 hrs but not feeling rested. Drinks 2-3 beers, or shots of hard ETOH, or glasses of wine nightly. Has not seen endocrinologist in over a year due to difficulty scheduling appointment. Sugars are running in the 200's every morning. Last few morning has not eaten breakfast and only goldfish crackers for lunch followed by normal dinner meal. Menstrual cycles regular, normal flow, lasting 5 days. Reports family history of paternal grandmother having breast CA. UTD on yearly eye exams. Admits to needing dental cleaning.   Review of Systems  Constitutional:  Negative for activity change, appetite change, fatigue and fever.  HENT:  Negative for dental problem, mouth sores, sore throat and trouble swallowing.   Respiratory:  Negative for cough, chest tightness, shortness of breath and wheezing.   Cardiovascular:  Negative for chest pain.  Gastrointestinal:  Negative for abdominal distention, abdominal pain, constipation, diarrhea, nausea and vomiting.  Genitourinary:  Negative for difficulty urinating, dysuria, enuresis,  frequency, genital sores, menstrual problem, pelvic pain, urgency and vaginal discharge.  Psychiatric/Behavioral:  Positive for agitation. The patient is nervous/anxious.       05/19/2022   10:51 AM 04/22/2021    4:38 PM 05/14/2020    5:39 PM  PHQ9 SCORE ONLY  PHQ-9 Total Score 10 0 14       05/19/2022   10:51 AM 05/14/2020    5:39 PM 11/11/2019    3:48 PM  GAD 7 : Generalized Anxiety Score  Nervous, Anxious, on Edge _0 Control/stop worrying _1 Worry too much - different things _2 Trouble relaxing _3 Restless _4 Easily annoyed or irritable _5 Afraid - awful might happen _6 Total GAD 7 Score _7 Anxiety Difficulty Not difficult at all Somewhat difficult    Diabetic foot exam performed. Full sensation noted bilaterally when using microfilament.     Objective:    Physical Exam Constitutional:      General: She is not in acute distress.    Appearance: Normal appearance. She is well-developed. She is not ill-appearing.  HENT:     Head:     Comments: Mild retraction R tympanic membrane. No erythema.     Right Ear: Tympanic membrane normal.     Mouth/Throat:     Mouth: Mucous membranes are moist.     Pharynx: Oropharynx is clear. No oropharyngeal exudate or posterior oropharyngeal erythema.  Eyes:     Extraocular Movements: Extraocular movements intact.     Pupils: Pupils are equal, round, and reactive to light.  Neck:  Thyroid: No thyromegaly.     Trachea: No tracheal deviation.     Comments: Thyroid non tender to palpation. No mass or goiter noted. Mildly cervical lymph enlargement more on R. Cardiovascular:     Rate and Rhythm: Normal rate and regular rhythm.     Heart sounds: Normal heart sounds. No murmur heard. Pulmonary:     Effort: Pulmonary effort is normal.     Breath sounds: Normal breath sounds.  Chest:  Breasts:    Right: No swelling, inverted nipple, mass, skin change or tenderness.     Left: No swelling, inverted  nipple, mass, skin change or tenderness.  Abdominal:     General: There is no distension.     Palpations: Abdomen is soft. There is no mass.     Tenderness: There is no abdominal tenderness.  Genitourinary:    Comments: Cervical and PAP exam deferred today. Patient denies abnormal discharge or lesions.  Musculoskeletal:        General: No swelling. Normal range of motion.     Cervical back: Normal range of motion and neck supple.  Lymphadenopathy:     Cervical: Cervical adenopathy present.     Upper Body:     Right upper body: No supraclavicular, axillary or pectoral adenopathy.     Left upper body: No supraclavicular, axillary or pectoral adenopathy.  Skin:    General: Skin is warm and dry.     Findings: No rash.  Neurological:     Mental Status: She is alert and oriented to person, place, and time.  Psychiatric:        Mood and Affect: Mood normal.        Behavior: Behavior normal.        Thought Content: Thought content normal.        Judgment: Judgment normal.   Vitals:   05/19/22 1046  BP: 112/68  Pulse: 77  Temp: 98.5 F (36.9 C)  Height: 5' 2.01" (1.575 m)  Weight: 75.2 kg  SpO2: 97%  BMI (Calculated): 30.32    Diabetic Foot Exam - Simple   Simple Foot Form Diabetic Foot exam was performed with the following findings: Yes 05/19/2022 10:40 AM  Visual Inspection No deformities, no ulcerations, no other skin breakdown bilaterally: Yes Sensation Testing Intact to touch and monofilament testing bilaterally: Yes Pulse Check Posterior Tibialis and Dorsalis pulse intact bilaterally: Yes Comments        Assessment & Plan:   Problem List Items Addressed This Visit       Endocrine   Type 1 diabetes mellitus ([place])   Relevant Orders   CBC with Differential/Platelet   CMP14+EGFR   Hemoglobin A1c   Microalbumin / creatinine urine ratio   Lipid panel   Other Visit Diagnoses     Well woman exam    -  Primary   Screening, lipid       Relevant Orders   Lipid  panel   High risk medication use       Relevant Orders   CBC with Differential/Platelet   CMP14+EGFR   Need for vaccination       Relevant Orders   Flu Vaccine QUAD 24moIM (Fluarix, Fluzone & Alfiuria Quad PF) (Completed)       Meds ordered this encounter  Medications   escitalopram (LEXAPRO) 10 MG tablet    Sig: Take 1 tablet (10 mg total) by mouth daily.    Dispense:  30 tablet    Refill:  0    Order  Specific Question:   Supervising Provider    Answer:   Kathyrn Drown [[id number]]    Plan: Yearly breast exam done. Strongly encouraged patient to schedule endocrinology appointment and keep regular appointments. Patient verbalized understanding. Discussed better coping mechanisms for anxiety. Recommended Lexapro 10 mg daily. Education provided on medication side effects to report and when to contact provider.    Labwork ordered: CBC, CMP, A1C, Urine ACR.  Flu vac today.  Strongly encourage to see endocrinologist regularly.  Follow up in one month for medication management.

## 2022-05-19 NOTE — Patient Instructions (Addendum)
Check about Tdap with pharmacy.

## 2022-05-20 ENCOUNTER — Encounter: Payer: Self-pay | Admitting: Nurse Practitioner

## 2022-05-20 NOTE — Progress Notes (Signed)
Patient ID: Joan Gonzalez, female   DOB: 05-Sep-1995, 26 y.o.   MRN: [id number]

## 2022-05-26 ENCOUNTER — Encounter: Payer: Self-pay | Admitting: Nurse Practitioner

## 2022-05-26 ENCOUNTER — Other Ambulatory Visit: Payer: Self-pay | Admitting: Nurse Practitioner

## 2022-05-26 MED ORDER — SERTRALINE HCL 50 MG PO TABS: ORAL_TABLET | ORAL | 0 refills | Status: DC

## 2022-06-16 ENCOUNTER — Telehealth: Payer: PRIVATE HEALTH INSURANCE | Admitting: Nurse Practitioner

## 2022-07-06 LAB — CMP14+EGFR
ALT: 27 IU/L (ref 0–32)
AST: 30 IU/L (ref 0–40)
Albumin/Globulin Ratio: 1.7 (ref 1.2–2.2)
Albumin: 4.5 g/dL (ref 4.0–5.0)
Alkaline Phosphatase: 80 IU/L (ref 44–121)
BUN/Creatinine Ratio: 18 (ref 9–23)
BUN: 14 mg/dL (ref 6–20)
Bilirubin Total: 0.3 mg/dL (ref 0.0–1.2)
CO2: 22 mmol/L (ref 20–29)
Calcium: 9.9 mg/dL (ref 8.7–10.2)
Chloride: 104 mmol/L (ref 96–106)
Creatinine, Ser: 0.76 mg/dL (ref 0.57–1.00)
Globulin, Total: 2.7 g/dL (ref 1.5–4.5)
Glucose: 55 mg/dL — ABNORMAL LOW (ref 70–99)
Potassium: 4.4 mmol/L (ref 3.5–5.2)
Sodium: 141 mmol/L (ref 134–144)
Total Protein: 7.2 g/dL (ref 6.0–8.5)
eGFR: 110 mL/min/{1.73_m2} (ref >59)

## 2022-07-06 LAB — CBC WITH DIFFERENTIAL/PLATELET
Basophils Absolute: 0 10*3/uL (ref 0.0–0.2)
Basos: 1 %
EOS (ABSOLUTE): 0.1 10*3/uL (ref 0.0–0.4)
Eos: 2 %
Hematocrit: 41.9 % (ref 34.0–46.6)
Hemoglobin: 13.8 g/dL (ref 11.1–15.9)
Immature Grans (Abs): 0 10*3/uL (ref 0.0–0.1)
Immature Granulocytes: 0 %
Lymphocytes Absolute: 2.5 10*3/uL (ref 0.7–3.1)
Lymphs: 39 %
MCH: 29.3 pg (ref 26.6–33.0)
MCHC: 32.9 g/dL (ref 31.5–35.7)
MCV: 89 fL (ref 79–97)
Monocytes Absolute: 0.6 10*3/uL (ref 0.1–0.9)
Monocytes: 9 %
Neutrophils Absolute: 3.1 10*3/uL (ref 1.4–7.0)
Neutrophils: 49 %
Platelets: 421 10*3/uL (ref 150–450)
RBC: 4.71 x10E6/uL (ref 3.77–5.28)
RDW: 12 % (ref 11.7–15.4)
WBC: 6.4 10*3/uL (ref 3.4–10.8)

## 2022-07-06 LAB — LIPID PANEL
Chol/HDL Ratio: 2.4 ratio (ref 0.0–4.4)
Cholesterol, Total: 203 mg/dL — ABNORMAL HIGH (ref 100–199)
HDL: 85 mg/dL (ref >39)
LDL Chol Calc (NIH): 106 mg/dL — ABNORMAL HIGH (ref 0–99)
Triglycerides: 69 mg/dL (ref 0–149)
VLDL Cholesterol Cal: 12 mg/dL (ref 5–40)

## 2022-07-06 LAB — HEMOGLOBIN A1C
Est. average glucose Bld gHb Est-mCnc: 146 mg/dL
Hgb A1c MFr Bld: 6.7 % — ABNORMAL HIGH (ref 4.8–5.6)

## 2022-07-06 LAB — MICROALBUMIN / CREATININE URINE RATIO
Creatinine, Urine: 78 mg/dL
Microalb/Creat Ratio: <4 mg/g creat (ref 0–29)
Microalbumin, Urine: <3 ug/mL

## 2022-07-27 ENCOUNTER — Emergency Department (HOSPITAL_COMMUNITY): Payer: PRIVATE HEALTH INSURANCE

## 2022-07-27 ENCOUNTER — Other Ambulatory Visit: Payer: Self-pay

## 2022-07-27 ENCOUNTER — Encounter (HOSPITAL_COMMUNITY): Payer: Self-pay | Admitting: Emergency Medicine

## 2022-07-27 ENCOUNTER — Emergency Department (HOSPITAL_COMMUNITY)
Admission: EM | Admit: 2022-07-27 | Discharge: 2022-07-27 | Disposition: A | Discharge status: Home / Self Care | Payer: PRIVATE HEALTH INSURANCE | Attending: Emergency Medicine | Admitting: Emergency Medicine

## 2022-07-27 DIAGNOSIS — R1013 Epigastric pain: Secondary | ICD-10-CM | POA: Diagnosis not present

## 2022-07-27 DIAGNOSIS — Z794 Long term (current) use of insulin: Secondary | ICD-10-CM | POA: Diagnosis not present

## 2022-07-27 DIAGNOSIS — E109 Type 1 diabetes mellitus without complications: Secondary | ICD-10-CM | POA: Insufficient documentation

## 2022-07-27 DIAGNOSIS — R1011 Right upper quadrant pain: Secondary | ICD-10-CM | POA: Diagnosis present

## 2022-07-27 LAB — URINALYSIS, ROUTINE W REFLEX MICROSCOPIC
Bilirubin Urine: NEGATIVE
Bilirubin Urine: NEGATIVE
Glucose, UA: NEGATIVE mg/dL
Glucose, UA: NEGATIVE mg/dL
Glucose, UA: NEGATIVE mg/dL
Hgb urine dipstick: NEGATIVE
Hgb urine dipstick: NEGATIVE
Hgb urine dipstick: NEGATIVE
Ketones, ur: 80 mg/dL — AB
Ketones, ur: 80 mg/dL — AB
Ketones, ur: >80 mg/dL — AB
Nitrite: NEGATIVE
Nitrite: NEGATIVE
Nitrite: NEGATIVE
Protein, ur: 30 mg/dL — AB
Protein, ur: 30 mg/dL — AB
Protein, ur: NEGATIVE mg/dL
Specific Gravity, Urine: 1.029 (ref 1.005–1.030)
Specific Gravity, Urine: 1.029 (ref 1.005–1.030)
Specific Gravity, Urine: >1.03 — ABNORMAL HIGH (ref 1.005–1.030)
pH: 5 (ref 5.0–8.0)
pH: 5 (ref 5.0–8.0)
pH: 6 (ref 5.0–8.0)

## 2022-07-27 LAB — CBC
HCT: 39.9 % (ref 36.0–46.0)
Hemoglobin: 13.3 g/dL (ref 12.0–15.0)
MCH: 29.3 pg (ref 26.0–34.0)
MCHC: 33.3 g/dL (ref 30.0–36.0)
MCV: 87.9 fL (ref 80.0–100.0)
Platelets: 325 10*3/uL (ref 150–400)
RBC: 4.54 MIL/uL (ref 3.87–5.11)
RDW: 12.4 % (ref 11.5–15.5)
WBC: 8.3 10*3/uL (ref 4.0–10.5)
nRBC: 0 % (ref 0.0–0.2)

## 2022-07-27 LAB — COMPREHENSIVE METABOLIC PANEL
ALT: 21 U/L (ref 0–44)
AST: 23 U/L (ref 15–41)
Albumin: 4 g/dL (ref 3.5–5.0)
Alkaline Phosphatase: 63 U/L (ref 38–126)
Anion gap: 11 (ref 5–15)
BUN: 16 mg/dL (ref 6–20)
CO2: 21 mmol/L — ABNORMAL LOW (ref 22–32)
Calcium: 8.7 mg/dL — ABNORMAL LOW (ref 8.9–10.3)
Chloride: 102 mmol/L (ref 98–111)
Creatinine, Ser: 0.77 mg/dL (ref 0.44–1.00)
GFR, Estimated: >60 mL/min (ref >60)
Glucose, Bld: 190 mg/dL — ABNORMAL HIGH (ref 70–99)
Potassium: 3.7 mmol/L (ref 3.5–5.1)
Sodium: 134 mmol/L — ABNORMAL LOW (ref 135–145)
Total Bilirubin: 0.6 mg/dL (ref 0.3–1.2)
Total Protein: 7.3 g/dL (ref 6.5–8.1)

## 2022-07-27 LAB — URINALYSIS, MICROSCOPIC (REFLEX)

## 2022-07-27 LAB — LIPASE, BLOOD: Lipase: 27 U/L (ref 11–51)

## 2022-07-27 LAB — PREGNANCY, URINE: Preg Test, Ur: NEGATIVE

## 2022-07-27 LAB — CBG MONITORING, ED: Glucose-Capillary: 162 mg/dL — ABNORMAL HIGH (ref 70–99)

## 2022-07-27 MED ORDER — LIDOCAINE VISCOUS HCL 2 % MT SOLN
15.0000 mL | Freq: Once | OROMUCOSAL | Status: AC
  Administered 2022-07-27: 15 mL via ORAL
  Filled 2022-07-27: qty 15

## 2022-07-27 MED ORDER — FAMOTIDINE 20 MG PO TABS: 20.0000 mg | ORAL_TABLET | Freq: Two times a day (BID) | ORAL | 0 refills | Status: DC

## 2022-07-27 MED ORDER — ONDANSETRON HCL 4 MG PO TABS: 4.0000 mg | ORAL_TABLET | Freq: Four times a day (QID) | ORAL | 0 refills | Status: DC

## 2022-07-27 MED ORDER — ACETAMINOPHEN 500 MG PO TABS
1000.0000 mg | ORAL_TABLET | Freq: Once | ORAL | Status: AC
  Administered 2022-07-27: 1000 mg via ORAL
  Filled 2022-07-27: qty 2

## 2022-07-27 MED ORDER — ALUM & MAG HYDROXIDE-SIMETH 200-200-20 MG/5ML PO SUSP
30.0000 mL | Freq: Once | ORAL | Status: AC
  Administered 2022-07-27: 30 mL via ORAL
  Filled 2022-07-27: qty 30

## 2022-07-27 MED ORDER — KETOROLAC TROMETHAMINE 15 MG/ML IJ SOLN
15.0000 mg | Freq: Once | INTRAMUSCULAR | Status: AC
  Administered 2022-07-27: 15 mg via INTRAVENOUS
  Filled 2022-07-27: qty 1

## 2022-07-27 NOTE — ED Triage Notes (Signed)
Pt c/o ruq/mid abd pain x 2 days. N/v once 2 days ago. Lnbm around 5 days ago. Had small bm yesterday that was black. States nothing makes pain worse or better. Nausea is intermittent. Has been eating/drinking. Nad. Color wnl in triage. States odor to urine but no other urinary changes. Denies vag bleeding, clear d/c but has had that in past.

## 2022-07-27 NOTE — Discharge Instructions (Addendum)
Thank you for coming to [facility] Emergency Department. You were seen for abdominal pain. We did an exam, labs, and imaging, and these showed no acute findings. We have prescribed zofran for nausea to take as needed every 6-8 hours. Please stay well hydrated. We have prescribed pepcid 20 mg twice per day for 30 days to take as well, which may help improve your pain.   Please follow up with your primary care provider within 1 week.   Do not hesitate to return to the ED or call 911 if you experience: -Worsening symptoms -Nausea/vomiting so severe you cannot eat or drink anything -Severe dehydration -Lightheadedness, passing out -Fevers/chills -Anything else that concerns you

## 2022-07-27 NOTE — ED Provider Notes (Signed)
[facility] EMERGENCY DEPARTMENT Provider Note   CSN: [id number] Arrival date & time: 07/27/22  9562     History  Chief Complaint  Patient presents with   Abdominal Pain    Joan Gonzalez is a 27 y.o. female G1P0 with T1DM, A/D who presents with abdominal pain.   Pt c/o ruq/mid abd pain x 2 days.  Pain currently rated 2 out of 10 but is intermittent.  Nausea with one episode of emesis 2 days ago. Had small BM  yesterday that was black, but she endorses taking pepto bismol to try to help her symptoms, no hematochezia.  Pain comes and goes and patient states nothing makes pain worse or better. Has been eating/drinking. States odor to urine but no other urinary changes including dysuria or hematuria.  Patient had 1 pregnancy a year ago that ended in a miscarriage, no other pregnancies.  Last menstrual cycle was July 08, 2022.  She denies any vaginal symptoms including itching burning discharge.  Also denies fever/chills, chest pain, shortness of breath, diarrhea, lower extremity edema.  No history of abdominal surgeries.  Has a Dexcom and insulin pump for her diabetes, sugars have been high in the morning approximately 200 but otherwise have been normal, no history of DKA.   Abdominal Pain      Home Medications Prior to Admission medications   Medication Sig Start Date End Date Taking? Authorizing Provider  famotidine (PEPCID) 20 MG tablet Take 1 tablet (20 mg total) by mouth 2 (two) times daily. 07/27/22  Yes Audley Hose, MD  Insulin Human (INSULIN PUMP) SOLN Inject into the skin.   Yes [provider]  insulin lispro (HUMALOG) 100 UNIT/ML injection Inject into the skin. Pt uses in pump   Yes [provider]  ondansetron (ZOFRAN) 4 MG tablet Take 1 tablet (4 mg total) by mouth every 6 (six) hours. 07/27/22  Yes Audley Hose, MD  sertraline (ZOLOFT) 50 MG tablet Take 1/2 tab po each morning x 6 days then one po qam Patient not taking: Reported on 07/27/2022  05/26/22   Nilda Simmer, NP  Continuous Blood Gluc Sensor (DEXCOM G6 SENSOR) MISC See admin instructions. 06/04/19   [provider]  Continuous Blood Gluc Transmit (DEXCOM G6 TRANSMITTER) MISC See admin instructions. 06/04/19   [provider]      Allergies    Lexapro [escitalopram] and Other    Review of Systems   Review of Systems  Gastrointestinal:  Positive for abdominal pain.   Review of systems Negative for f/c.  A 10 point review of systems was performed and is negative unless otherwise reported in HPI.  Physical Exam Updated Vital Signs BP 121/73   Pulse 86   Temp 98.1 F (36.7 C) (Oral)   Resp 18   LMP 07/08/2022   SpO2 100%  Physical Exam General: Normal appearing female, lying in bed.  HEENT: Sclera anicteric, MMM, trachea midline.  Cardiology: RRR, no murmurs/rubs/gallops. BL radial and DP pulses equal bilaterally.  Resp: Normal respiratory rate and effort. CTAB, no wheezes, rhonchi, crackles.  Abd: Mild epigastric and RUQ TTP. Soft, non-distended. No rebound tenderness or guarding.  GU: Deferred. MSK: No peripheral edema or signs of trauma. Extremities without deformity or TTP. No cyanosis or clubbing. Skin: warm, dry. No rashes or lesions. Back: No CVA tenderness Neuro: A&Ox4, CNs II-XII grossly intact. MAEs. Sensation grossly intact.  Psych: Normal mood and affect.   ED Results / Procedures / Treatments   Labs (  all labs ordered are listed, but only abnormal results are displayed) Labs Reviewed  COMPREHENSIVE METABOLIC PANEL - Abnormal; Notable for the following components:      Result Value   Sodium 134 (*)    CO2 21 (*)    Glucose, Bld 190 (*)    Calcium 8.7 (*)    All other components within normal limits  URINALYSIS, ROUTINE W REFLEX MICROSCOPIC - Abnormal; Notable for the following components:   Color, Urine AMBER (*)    APPearance HAZY (*)    Ketones, ur 80 (*)    Protein, ur 30 (*)    Leukocytes,Ua SMALL (*)     Bacteria, UA RARE (*)    All other components within normal limits  URINALYSIS, ROUTINE W REFLEX MICROSCOPIC - Abnormal; Notable for the following components:   APPearance HAZY (*)    Ketones, ur 80 (*)    Protein, ur 30 (*)    Leukocytes,Ua SMALL (*)    Bacteria, UA RARE (*)    All other components within normal limits  URINALYSIS, ROUTINE W REFLEX MICROSCOPIC - Abnormal; Notable for the following components:   APPearance CLOUDY (*)    Specific Gravity, Urine >1.030 (*)    Bilirubin Urine SMALL (*)    Ketones, ur >80 (*)    Leukocytes,Ua SMALL (*)    All other components within normal limits  URINALYSIS, MICROSCOPIC (REFLEX) - Abnormal; Notable for the following components:   Bacteria, UA MANY (*)    All other components within normal limits  CBG MONITORING, ED - Abnormal; Notable for the following components:   Glucose-Capillary 162 (*)    All other components within normal limits  LIPASE, BLOOD  CBC  PREGNANCY, URINE    EKG None  Radiology US Abdomen Limited RUQ (LIVER/GB)  Result Date: 07/27/2022 CLINICAL DATA:  Right upper quadrant pain for 2 days with nausea. EXAM: ULTRASOUND ABDOMEN LIMITED RIGHT UPPER QUADRANT COMPARISON:  CT abdomen pelvis 08/26/2018 FINDINGS: Gallbladder: No gallstones or wall thickening visualized. No sonographic Murphy sign noted by sonographer. Common bile duct: Diameter: 0.2 cm, within normal limits Liver: No focal lesion identified. Within normal limits in parenchymal echogenicity. Portal vein is patent on color Doppler imaging with normal direction of blood flow towards the liver. Other: None. IMPRESSION: No sonographic abnormality in the right upper quadrant. Electronically Signed   By: Emmaline Kluver M.D.   On: 07/27/2022 09:50    Procedures Procedures    Medications Ordered in ED Medications  acetaminophen (TYLENOL) tablet 1,000 mg (1,000 mg Oral Given 07/27/22 1101)  ketorolac (TORADOL) 15 MG/ML injection 15 mg (15 mg Intravenous Given  07/27/22 1117)  alum & mag hydroxide-simeth (MAALOX/MYLANTA) 200-200-20 MG/5ML suspension 30 mL (30 mLs Oral Given 07/27/22 1101)    And  lidocaine (XYLOCAINE) 2 % viscous mouth solution 15 mL (15 mLs Oral Given 07/27/22 1101)    ED Course/ Medical Decision Making/ A&P                          Medical Decision Making Amount and/or Complexity of Data Reviewed Labs: ordered. Decision-making details documented in ED Course. Radiology: ordered. Decision-making details documented in ED Course.  Risk OTC drugs. Prescription drug management.    This patient presents to the ED for concern of abd pain, N/V, this involves an extensive number of treatment options, and is a complaint that carries with it a high risk of complications and morbidity.  I considered the following differential and  admission for this acute, potentially life threatening condition.   MDM:    For DDX for abdominal pain includes but is not limited to: Abdominal exam without peritoneal signs. No evidence of acute abdomen at this time.  For right upper quadrant pain that is colicky associate with nausea vomiting greatest concern for biliary colic, cholelithiasis.  Patient has had no fevers or chills but also consider cholecystitis.  She is overall well-appearing and nontoxic, vitally stable and afebrile, low concern for cholangitis.  Consider acute pancreatitis epigastric pain and will obtain a lipase.  With foul-smelling urine also consider UTI, no CVA tenderness/fever to suggest pyelonephritis.  Consider PUD/gastritis/GERD.  Lower concern for appendicitis, diverticulitis, bowel obstruction, ectopic pregnancy.  Patient has had no vaginal symptoms to suggest PID, no pelvic pain to suggest ovarian torsion.  Sugars have been in the 200s, no history DKA, lower concern for DKA but will check CBG and chemistry.  Will obtain labs and right upper quadrant ultrasound.    Clinical Course as of 07/27/22 1244  Thu Jul 27, 2022  0745  Glucose-Capillary(!): 162 [HN]  0745 CBC Wnl, no leukocytosis or anemia [HN]  0756 Lipase: 27 [HN]  0756 Comprehensive metabolic panel(!) Largely unremarkable, LFTs okay [HN]  0756 Preg Test, Ur: NEGATIVE [HN]  0831 Urinalysis, Routine w reflex microscopic Urine, Clean Catch(!) UA w/ 11-20 squamous cells, will repeat [HN]  1015 US Abdomen Limited RUQ (LIVER/GB) No sonographic abnormality in the right upper quadrant. [HN]  1223 Patient states she does not have the pain anymore after Tylenol/Toradol, Maalox, but she does state that it is intermittent anyway and she is not sure what helped her today. I discussed with patient and her mother extensively about the possibility of colicky RUQ pain being cholelithiasis that we may not have captured on [place]. CMP reassuring against biliary obstruction, no leukocytosis to suggest in fection, lipase negative.  Possible the patient is having cholelithiasis and there was no stone captured on the ultrasound today.  Possible that patient has a gastritis component to her symptoms as she does report a history of reflux.  Patient is stable to f/u as an outpatient. Will prescribe zofran for nausea to stay well hydrated at home.  Will also prescribe Pepcid 20 mg twice daily to see if they improve her symptoms.  Patient mother report understanding. [HN]  1241 Most recent UA with high spec grav and >80 ketones. Patient is taking PO here now in the department. No UTI. Patient will be DC'd w/ discharge instructions and return precautions, PCP follow-up. [HN]    Clinical Course User Index [HN] Loetta Rough, MD    Labs: I Ordered, and personally interpreted labs.  The pertinent results include:  those listed above  Imaging Studies ordered: I ordered imaging studies including RUQ [place] I independently visualized and interpreted imaging. I agree with the radiologist interpretation  Additional history obtained from chart review, mother at bedside.   Reevaluation: After  the interventions noted above, I reevaluated the patient and found that they have :improved  Social Determinants of Health: Patient lives independently   Disposition:  DC  Co morbidities that complicate the patient evaluation  Past Medical History:  Diagnosis Date   Diabetes mellitus without complication (HCC)      Medicines Meds ordered this encounter  Medications   acetaminophen (TYLENOL) tablet 1,000 mg   ketorolac (TORADOL) 15 MG/ML injection 15 mg   AND Linked Order Group    alum & mag hydroxide-simeth (MAALOX/MYLANTA) 200-200-20 MG/5ML suspension 30  mL    lidocaine (XYLOCAINE) 2 % viscous mouth solution 15 mL   ondansetron (ZOFRAN) 4 MG tablet    Sig: Take 1 tablet (4 mg total) by mouth every 6 (six) hours.    Dispense:  12 tablet    Refill:  0   famotidine (PEPCID) 20 MG tablet    Sig: Take 1 tablet (20 mg total) by mouth 2 (two) times daily.    Dispense:  30 tablet    Refill:  0    I have reviewed the patients home medicines and have made adjustments as needed  Problem List / ED Course: Problem List Items Addressed This Visit   None Visit Diagnoses     Colicky RUQ abdominal pain    -  Primary                   This note was created using dictation software, which may contain spelling or grammatical errors.    Audley Hose, MD 07/27/22 [phone]

## 2022-07-28 ENCOUNTER — Telehealth: Payer: Self-pay

## 2022-07-28 NOTE — Telephone Encounter (Signed)
Transition Care Management Unsuccessful Follow-up Telephone Call  Date of discharge and from where:  [place] 1/18  Attempts:  1st Attempt  Reason for unsuccessful TCM follow-up call:  Left voice message    

## 2022-08-06 ENCOUNTER — Telehealth: Payer: Self-pay | Admitting: Urgent Care

## 2022-08-06 DIAGNOSIS — R8290 Unspecified abnormal findings in urine: Secondary | ICD-10-CM

## 2022-08-06 DIAGNOSIS — R3989 Other symptoms and signs involving the genitourinary system: Secondary | ICD-10-CM

## 2022-08-06 MED ORDER — NITROFURANTOIN MONOHYD MACRO 100 MG PO CAPS: 100.0000 mg | ORAL_CAPSULE | Freq: Two times a day (BID) | ORAL | 0 refills | Status: DC

## 2022-08-06 NOTE — Progress Notes (Signed)
E-Visit for Urinary Problems  We are sorry that you are not feeling well.  Here is how we plan to help!  Based on what you shared with me it looks like you most likely have a simple urinary tract infection.  A UTI (Urinary Tract Infection) is a bacterial infection of the bladder.  Most cases of urinary tract infections are simple to treat but a key part of your care is to encourage you to drink plenty of fluids and watch your symptoms carefully.  I have prescribed MacroBid 100 mg twice a day for 5 days.  Your symptoms should gradually improve. Call us if the burning in your urine worsens, you develop worsening fever, back pain or pelvic pain or if your symptoms do not resolve after completing the antibiotic.  Urinary tract infections can be prevented by drinking plenty of water to keep your body hydrated.  Also be sure when you wipe, wipe from front to back and don't hold it in!  If possible, empty your bladder every 4 hours.  HOME CARE Drink plenty of fluids Compete the full course of the antibiotics even if the symptoms resolve Remember, when you need to go.go. Holding in your urine can increase the likelihood of getting a UTI! GET HELP RIGHT AWAY IF: You cannot urinate You get a high fever Worsening back pain occurs You see blood in your urine You feel sick to your stomach or throw up You feel like you are going to pass out  MAKE SURE YOU  Understand these instructions. Will watch your condition. Will get help right away if you are not doing well or get worse.   Thank you for choosing an e-visit.  Your e-visit answers were reviewed by a board certified advanced clinical practitioner to complete your personal care plan. Depending upon the condition, your plan could have included both over the counter or prescription medications.  Please review your pharmacy choice. Make sure the pharmacy is open so you can pick up prescription now. If there is a problem, you may contact your  provider through MyChart messaging and have the prescription routed to another pharmacy.  Your safety is important to us. If you have drug allergies check your prescription carefully.   For the next 24 hours you can use MyChart to ask questions about today's visit, request a non-urgent call back, or ask for a work or school excuse. You will get an email in the next two days asking about your experience. I hope that your e-visit has been valuable and will speed your recovery.   I have spent 5 minutes in review of e-visit questionnaire, review and updating patient chart, medical decision making and response to patient.   Bridgid Printz L Loveta Dellis, PA    

## 2022-12-21 LAB — OB RESULTS CONSOLE HEPATITIS B SURFACE ANTIGEN: Hepatitis B Surface Ag: NEGATIVE

## 2022-12-21 LAB — OB RESULTS CONSOLE RPR: RPR: NONREACTIVE

## 2022-12-21 LAB — OB RESULTS CONSOLE RUBELLA ANTIBODY, IGM: Rubella: IMMUNE

## 2022-12-21 LAB — OB RESULTS CONSOLE HIV ANTIBODY (ROUTINE TESTING): HIV: NONREACTIVE

## 2022-12-21 LAB — OB RESULTS CONSOLE VARICELLA ZOSTER ANTIBODY, IGG: Varicella: IMMUNE

## 2023-01-08 LAB — OB RESULTS CONSOLE GC/CHLAMYDIA
Chlamydia: NEGATIVE
Neisseria Gonorrhea: NEGATIVE

## 2023-03-08 ENCOUNTER — Inpatient Hospital Stay (HOSPITAL_COMMUNITY)
Admission: AD | Admit: 2023-03-08 | Discharge: 2023-03-08 | Disposition: A | Discharge status: Home / Self Care | Payer: No Typology Code available for payment source | Attending: Obstetrics & Gynecology | Admitting: Obstetrics & Gynecology

## 2023-03-08 ENCOUNTER — Encounter (HOSPITAL_COMMUNITY): Payer: Self-pay | Admitting: *Deleted

## 2023-03-08 DIAGNOSIS — O24112 Pre-existing diabetes mellitus, type 2, in pregnancy, second trimester: Secondary | ICD-10-CM | POA: Insufficient documentation

## 2023-03-08 DIAGNOSIS — O4692 Antepartum hemorrhage, unspecified, second trimester: Secondary | ICD-10-CM | POA: Insufficient documentation

## 2023-03-08 DIAGNOSIS — O26892 Other specified pregnancy related conditions, second trimester: Secondary | ICD-10-CM | POA: Diagnosis present

## 2023-03-08 DIAGNOSIS — N841 Polyp of cervix uteri: Secondary | ICD-10-CM | POA: Diagnosis not present

## 2023-03-08 DIAGNOSIS — O3442 Maternal care for other abnormalities of cervix, second trimester: Secondary | ICD-10-CM | POA: Diagnosis not present

## 2023-03-08 DIAGNOSIS — Z3A21 21 weeks gestation of pregnancy: Secondary | ICD-10-CM | POA: Insufficient documentation

## 2023-03-08 DIAGNOSIS — N939 Abnormal uterine and vaginal bleeding, unspecified: Secondary | ICD-10-CM | POA: Diagnosis not present

## 2023-03-08 DIAGNOSIS — Z711 Person with feared health complaint in whom no diagnosis is made: Secondary | ICD-10-CM

## 2023-03-08 DIAGNOSIS — N888 Other specified noninflammatory disorders of cervix uteri: Secondary | ICD-10-CM | POA: Diagnosis not present

## 2023-03-08 LAB — WET PREP, GENITAL
Clue Cells Wet Prep HPF POC: NONE SEEN
Sperm: NONE SEEN
Trich, Wet Prep: NONE SEEN
WBC, Wet Prep HPF POC: >=10 — AB (ref <10)
Yeast Wet Prep HPF POC: NONE SEEN

## 2023-03-08 LAB — URINALYSIS, ROUTINE W REFLEX MICROSCOPIC
Bilirubin Urine: NEGATIVE
Glucose, UA: NEGATIVE mg/dL
Hgb urine dipstick: NEGATIVE
Ketones, ur: NEGATIVE mg/dL
Leukocytes,Ua: NEGATIVE
Nitrite: NEGATIVE
Protein, ur: NEGATIVE mg/dL
Specific Gravity, Urine: 1.021 (ref 1.005–1.030)
pH: 6 (ref 5.0–8.0)

## 2023-03-08 NOTE — Progress Notes (Signed)
Dorathy Daft CNM in to do bedside u/s and discuss d/c plan. Written and verbal d/c instructions given and understanding voiced.

## 2023-03-08 NOTE — MAU Provider Note (Signed)
History     CSN: [id number]  Arrival date and time: 03/08/23 1820   Event Date/Time   First Provider Initiated Contact with Patient 03/08/23 1956      Chief Complaint  Patient presents with   Vaginal Discharge   Abdominal Pain   Joan Gonzalez , a  27 y.o. G2P0010 at [redacted]w[redacted]d presents to MAU with complaints of light pink, brown and red vaginal spotting  that started this afternoon. She states that with wiping today she started seeing "jelly like mucus vaginal discharge that has streaks of blood in it and became concerned. She states that she only sees it with wiping and denies having to wear a pad or panty liner. She denies bright red heavy bleeding or passing clots. She denies recent intercourse. She also noted some lower abdominal cramping that has been on-going throughout the pregnancy. She denies worsening or alleviating symptoms. She endorses feeling flutters.           OB History     Gravida  2   Para  0   Term  0   Preterm  0   AB  1   Living  0      SAB  0   IAB  0   Ectopic  0   Multiple  0   Live Births  0           Past Medical History:  Diagnosis Date   Diabetes mellitus without complication (HCC)     Past Surgical History:  Procedure Laterality Date   dental implants     DENTAL RESTORATION/EXTRACTION WITH X-RAY     TYMPANOSTOMY TUBE PLACEMENT      Family History  Problem Relation Age of Onset   Asthma Sister    Seizures Sister    Cancer Paternal Grandmother        breast   COPD Paternal Grandfather     Social History   Tobacco Use   Smoking status: Never   Smokeless tobacco: Never  Vaping Use   Vaping status: Never Used  Substance Use Topics   Alcohol use: Yes    Comment: occ   Drug use: No    Allergies:  Allergies  Allergen Reactions   Lexapro [Escitalopram] Other (See Comments)    Racing thoughts; did not like how it made her feel   Other Rash    Adhesive tape    Medications Prior to Admission  Medication  Sig Dispense Refill Last Dose   Prenatal Vit-Fe Fumarate-FA (PRENATAL MULTIVITAMIN) TABS tablet Take 1 tablet by mouth daily at 12 noon.      sertraline (ZOLOFT) 50 MG tablet Take 1/2 tab po each morning x 6 days then one po qam (Patient not taking: Reported on 07/27/2022) 30 tablet 0    Continuous Blood Gluc Sensor (DEXCOM G6 SENSOR) MISC See admin instructions.      Continuous Blood Gluc Transmit (DEXCOM G6 TRANSMITTER) MISC See admin instructions.      famotidine (PEPCID) 20 MG tablet Take 1 tablet (20 mg total) by mouth 2 (two) times daily. (Patient not taking: Reported on 03/08/2023) 30 tablet 0 Not Taking   Insulin Human (INSULIN PUMP) SOLN Inject into the skin.      insulin lispro (HUMALOG) 100 UNIT/ML injection Inject into the skin. Pt uses in pump      nitrofurantoin, macrocrystal-monohydrate, (MACROBID) 100 MG capsule Take 1 capsule (100 mg total) by mouth 2 (two) times daily. 10 capsule 0    ondansetron (ZOFRAN)  4 MG tablet Take 1 tablet (4 mg total) by mouth every 6 (six) hours. (Patient not taking: Reported on 03/08/2023) 12 tablet 0 Not Taking    Review of Systems  Constitutional:  Negative for chills, fatigue and fever.  Eyes:  Negative for pain and visual disturbance.  Respiratory:  Negative for apnea, shortness of breath and wheezing.   Cardiovascular:  Negative for chest pain and palpitations.  Gastrointestinal:  Positive for abdominal pain. Negative for constipation, diarrhea, nausea and vomiting.  Genitourinary:  Positive for pelvic pain, vaginal bleeding and vaginal discharge. Negative for difficulty urinating, dysuria and vaginal pain.  Musculoskeletal:  Negative for back pain.  Neurological:  Negative for seizures, weakness and headaches.  Psychiatric/Behavioral:  Negative for suicidal ideas.    Physical Exam   Blood pressure 121/60, pulse 85, temperature 98.8 F (37.1 C), resp. rate 17, height 5' 2.5" (1.588 m), weight 87.5 kg, last menstrual period 07/08/2022, SpO2  100%.  Physical Exam Vitals and nursing note reviewed. Exam conducted with a chaperone present.  Constitutional:      General: She is not in acute distress.    Appearance: Normal appearance.  HENT:     Head: Normocephalic.  Pulmonary:     Effort: Pulmonary effort is normal.  Abdominal:     General: There is no distension.     Palpations: Abdomen is soft.     Tenderness: There is no abdominal tenderness.     Comments: Gravid uterus.   Genitourinary:    Vagina: No vaginal discharge or bleeding.     Comments: No bleeding noted in vaginal vault. Small friable 1cm x 1cm polyp noted at cervical os. On digital exam, noted to be mobile and only attached by a small thread of tissue.  Musculoskeletal:     Cervical back: Normal range of motion.  Skin:    General: Skin is warm and dry.  Neurological:     Mental Status: She is alert and oriented to person, place, and time.  Psychiatric:        Mood and Affect: Mood normal.    FHT obtained in triage  MAU Course  Procedures Orders Placed This Encounter  Procedures   Wet prep, genital   Urinalysis, Routine w reflex microscopic -Urine, Clean Catch   Results for orders placed or performed during the hospital encounter of 03/08/23 (from the past 48 hour(s))  Urinalysis, Routine w reflex microscopic -Urine, Clean Catch     Status: None   Collection Time: 03/08/23  7:17 PM  Result Value Ref Range   Color, Urine YELLOW YELLOW   APPearance CLEAR CLEAR   Specific Gravity, Urine 1.021 1.005 - 1.030   pH 6.0 5.0 - 8.0   Glucose, UA NEGATIVE NEGATIVE mg/dL   Hgb urine dipstick NEGATIVE NEGATIVE   Bilirubin Urine NEGATIVE NEGATIVE   Ketones, ur NEGATIVE NEGATIVE mg/dL   Protein, ur NEGATIVE NEGATIVE mg/dL   Nitrite NEGATIVE NEGATIVE   Leukocytes,Ua NEGATIVE NEGATIVE    Comment: Performed at [facility], [address], [place], [place] [address]  Wet prep, genital     Status: Abnormal   Collection Time: 03/08/23  8:41 PM   Specimen:  Vaginal  Result Value Ref Range   Yeast Wet Prep HPF POC NONE SEEN NONE SEEN   Trich, Wet Prep NONE SEEN NONE SEEN   Clue Cells Wet Prep HPF POC NONE SEEN NONE SEEN   WBC, Wet Prep HPF POC >=10 (A) <10   Sperm NONE SEEN  Comment: Performed at [facility], [address], [place], [place] [address]     Patient informed that the ultrasound is considered a limited OB ultrasound and is not intended to be a complete ultrasound exam.  Patient also informed that the ultrasound is not being completed with the intent of assessing for fetal or placental anomalies or any pelvic abnormalities.  Explained that the purpose of today's ultrasound is to assess for viability.  Patient acknowledges the purpose of the exam and the limitations of the study.   MDM - UA normal  - Small polyp noted. Friable when touched with a Q-tip. Highly suspicious of source of vaginal spotting.  - Dilation: Closed Effacement (%): Thick Exam by:: Dorathy Daft CNM - Low suspicion for preterm labor.  - Wet prep and GC pending.  - Wet prep normal  - Patient still very anxious about baby. Bedside Limited US performed for patient reassurance. FHT doppler 160's  - plan for discharge   Assessment and Plan   1. Vaginal spotting   2. Polyp at cervical os   3. Friable cervix   4. [redacted] weeks gestation of pregnancy   5. Physically well but worried    - Reviewed cervical polyp and bleeding expectations associated with them.  - Reviewed worsening signs and return precautions.  - Preterm labor precautions reviewed  - Patient discharged home in stable condition and may return to MAU as needed.   Claudette Head, MSN CNM  03/08/2023, 7:56 PM

## 2023-03-08 NOTE — MAU Note (Signed)
.  Joan Gonzalez is a 27 y.o. at [redacted]w[redacted]d here in MAU reporting this evening when she went to the BR and wiped she saw pink/brown mucous on tissue. Mild cramping which is not new. No recent intercourse. Feels like she may be leaking small amt of clear fld which has been the entire pregnancy  Onset of complaint: this evening Pain score: 2 Vitals:   03/08/23 1914 03/08/23 1917  BP:  121/60  Pulse: 85   Resp: 17   Temp: 98.8 F (37.1 C)   SpO2: 100%      FHT:150 Lab orders placed from triage:   U/A

## 2023-03-15 LAB — GC/CHLAMYDIA PROBE AMP (~~LOC~~) NOT AT ARMC
Chlamydia: NEGATIVE
Comment: NEGATIVE
Comment: NORMAL
Neisseria Gonorrhea: NEGATIVE

## 2023-04-26 LAB — OB RESULTS CONSOLE RPR: RPR: NONREACTIVE

## 2023-05-23 ENCOUNTER — Ambulatory Visit: Payer: 59 | Admitting: Nurse Practitioner

## 2023-05-23 ENCOUNTER — Encounter: Payer: Self-pay | Admitting: Nurse Practitioner

## 2023-05-23 VITALS — BP 107/66 | HR 78 | Ht 62.5 in | Wt 211.0 lb

## 2023-05-23 DIAGNOSIS — F419 Anxiety disorder, unspecified: Secondary | ICD-10-CM | POA: Diagnosis not present

## 2023-05-23 DIAGNOSIS — F32A Depression, unspecified: Secondary | ICD-10-CM | POA: Diagnosis not present

## 2023-05-23 DIAGNOSIS — Z0000 Encounter for general adult medical examination without abnormal findings: Secondary | ICD-10-CM

## 2023-05-23 DIAGNOSIS — Z0001 Encounter for general adult medical examination with abnormal findings: Secondary | ICD-10-CM

## 2023-05-23 NOTE — Progress Notes (Signed)
Subjective:    Patient ID: Joan Gonzalez, female    DOB: 1996/03/11, 27 y.o.   MRN: [id number]  HPI  The patient comes in today for a wellness visit.    A review of their health history was completed.  A review of medications was also completed.  Any needed refills; none  Eating habits: fair  Falls/  MVA accidents in past few months: none  Regular exercise: currently [redacted] weeks pregnant; limited due to pain in the lower back  Specialist pt sees on regular basis: [facility]; weekly follow up; also followed by endocrinology for type 1 diabetes  Preventative health issues were discussed.   Additional concerns: none  Pregnancy going well Regular eye exams Due to dental exams   Review of Systems  Constitutional:  Positive for fatigue.  Respiratory:  Negative for cough, chest tightness, shortness of breath and wheezing.   Cardiovascular:  Negative for chest pain.  Gastrointestinal:  Positive for constipation. Negative for abdominal distention, abdominal pain, diarrhea, nausea and vomiting.  Genitourinary:  Negative for difficulty urinating, dysuria, frequency and urgency.      05/19/2022   10:51 AM  Depression screen PHQ 2/9  Decreased Interest 1  Down, Depressed, Hopeless 1  PHQ - 2 Score 2  Altered sleeping 1  Tired, decreased energy 1  Change in appetite 1  Feeling bad or failure about yourself  1  Trouble concentrating 2  Moving slowly or fidgety/restless 1  Suicidal thoughts 1  PHQ-9 Score 10  Difficult doing work/chores Somewhat difficult      05/19/2022   10:51 AM 05/14/2020    5:39 PM 11/11/2019    3:48 PM  GAD 7 : Generalized Anxiety Score  Nervous, Anxious, on Edge 2 3 1   Control/stop worrying 2 3 1   Worry too much - different things 2 3 1   Trouble relaxing 2 1 1   Restless 1 1 1   Easily annoyed or irritable 2 2 1   Afraid - awful might happen 2 1 1   Total GAD 7 Score 13 14 7   Anxiety Difficulty Not difficult at all Somewhat difficult           Objective:   Physical Exam Vitals and nursing note reviewed.  Constitutional:      General: She is not in acute distress. Cardiovascular:     Rate and Rhythm: Normal rate and regular rhythm.     Heart sounds: No murmur heard. Pulmonary:     Effort: Pulmonary effort is normal. No respiratory distress.     Breath sounds: Normal breath sounds.  Abdominal:     Comments: Abdominal exam deferred due to pregnancy  Genitourinary:    Comments: Breast and pelvic exams deferred; followed by OB/GYN Musculoskeletal:     Cervical back: Normal range of motion.     Right lower leg: No edema.     Left lower leg: No edema.  Lymphadenopathy:     Cervical: No cervical adenopathy.  Neurological:     Mental Status: She is alert.  Psychiatric:        Mood and Affect: Mood normal.        Behavior: Behavior normal.        Thought Content: Thought content normal.        Judgment: Judgment normal.    Today's Vitals   05/23/23 1037  BP: 107/66  Pulse: 78  SpO2: 99%  Weight: 211 lb (95.7 kg)  Height: 5' 2.5" (1.588 m)   Body mass index is  37.98 kg/m.        Assessment & Plan:   Problem List Items Addressed This Visit       Other   Anxiety and depression   Other Visit Diagnoses     Annual physical exam    -  Primary      Recommend patient discuss options for treatment for anxiety and depression with her OB. Recommend dental exam. Defers flu vaccine. Will discuss with OB. Tdap may be given post partum. No labs today, done by endocrinologist and OB. Return in about 1 year (around 05/22/2024) for physical.

## 2023-06-12 LAB — OB RESULTS CONSOLE GBS: GBS: POSITIVE

## 2023-06-16 ENCOUNTER — Other Ambulatory Visit: Payer: Self-pay | Admitting: Obstetrics and Gynecology

## 2023-06-16 ENCOUNTER — Inpatient Hospital Stay (HOSPITAL_COMMUNITY)
Admission: AD | Admit: 2023-06-16 | Discharge: 2023-06-16 | Disposition: A | Discharge status: Home / Self Care | Payer: 59 | Attending: Obstetrics and Gynecology | Admitting: Obstetrics and Gynecology

## 2023-06-16 ENCOUNTER — Encounter (HOSPITAL_COMMUNITY): Payer: Self-pay | Admitting: Obstetrics and Gynecology

## 2023-06-16 ENCOUNTER — Other Ambulatory Visit: Payer: Self-pay

## 2023-06-16 DIAGNOSIS — O26893 Other specified pregnancy related conditions, third trimester: Secondary | ICD-10-CM

## 2023-06-16 DIAGNOSIS — Z3A36 36 weeks gestation of pregnancy: Secondary | ICD-10-CM | POA: Diagnosis not present

## 2023-06-16 DIAGNOSIS — L299 Pruritus, unspecified: Secondary | ICD-10-CM | POA: Diagnosis not present

## 2023-06-16 DIAGNOSIS — O26643 Intrahepatic cholestasis of pregnancy, third trimester: Secondary | ICD-10-CM

## 2023-06-16 LAB — COMPREHENSIVE METABOLIC PANEL
ALT: 73 U/L — ABNORMAL HIGH (ref 0–44)
AST: 57 U/L — ABNORMAL HIGH (ref 15–41)
Albumin: 2.2 g/dL — ABNORMAL LOW (ref 3.5–5.0)
Alkaline Phosphatase: 204 U/L — ABNORMAL HIGH (ref 38–126)
Anion gap: 11 (ref 5–15)
BUN: 15 mg/dL (ref 6–20)
CO2: 20 mmol/L — ABNORMAL LOW (ref 22–32)
Calcium: 8.9 mg/dL (ref 8.9–10.3)
Chloride: 106 mmol/L (ref 98–111)
Creatinine, Ser: 0.77 mg/dL (ref 0.44–1.00)
GFR, Estimated: >60 mL/min (ref >60)
Glucose, Bld: 85 mg/dL (ref 70–99)
Potassium: 4.3 mmol/L (ref 3.5–5.1)
Sodium: 137 mmol/L (ref 135–145)
Total Bilirubin: 0.9 mg/dL (ref <1.2)
Total Protein: 6.2 g/dL — ABNORMAL LOW (ref 6.5–8.1)

## 2023-06-16 LAB — URINALYSIS, ROUTINE W REFLEX MICROSCOPIC
Bilirubin Urine: NEGATIVE
Glucose, UA: NEGATIVE mg/dL
Hgb urine dipstick: NEGATIVE
Ketones, ur: NEGATIVE mg/dL
Leukocytes,Ua: NEGATIVE
Nitrite: NEGATIVE
Protein, ur: NEGATIVE mg/dL
Specific Gravity, Urine: 1.019 (ref 1.005–1.030)
pH: 6 (ref 5.0–8.0)

## 2023-06-16 MED ORDER — HYDROXYZINE HCL 25 MG PO TABS
25.0000 mg | ORAL_TABLET | Freq: Once | ORAL | Status: AC
  Administered 2023-06-16: 25 mg via ORAL
  Filled 2023-06-16: qty 1

## 2023-06-16 MED ORDER — URSODIOL 300 MG PO CAPS: 300.0000 mg | ORAL_CAPSULE | Freq: Two times a day (BID) | ORAL | 2 refills | Status: DC

## 2023-06-16 MED ORDER — URSODIOL 300 MG PO CAPS: 300.0000 mg | ORAL_CAPSULE | Freq: Two times a day (BID) | ORAL | 0 refills | Status: DC

## 2023-06-16 MED ORDER — URSODIOL 300 MG PO CAPS
300.0000 mg | ORAL_CAPSULE | Freq: Once | ORAL | Status: AC
  Administered 2023-06-16: 300 mg via ORAL
  Filled 2023-06-16: qty 1

## 2023-06-16 NOTE — MAU Note (Signed)
.  Joan Gonzalez is a 27 y.o. at [redacted]w[redacted]d here in MAU reporting: itching all over that won't stop - was seen on Thursday in the office and had labs drawn - still pending results; was given hydroxyzine and it has not helped. Also reports taking benadryl without relief. Reports intermittent tightness in mid abdomen. Denies VB or LOF.  Endorses FM.   Pain score: 2 Vitals:   06/16/23 0618  BP: 126/81  Pulse: 76  Resp: 19  Temp: 98.8 F (37.1 C)  SpO2: 98%     FHT:135 Lab orders placed from triage:  UA

## 2023-06-16 NOTE — MAU Provider Note (Addendum)
History     CSN: [id number]  Arrival date and time: 06/16/23 1478   Event Date/Time   First Provider Initiated Contact with Patient 06/16/23 [phone]      Chief Complaint  Patient presents with   Pruritis    Joan Gonzalez is a 27 y.o. G2P0010 at [redacted]w[redacted]d who receives care at [facility].  She presents today for itching.  She reports symptoms started Tuesday.  She was seen on Thursday and started on Hydroxyzine.  She states she took one dose and discontinued.  She states she also took benadryl (liquid and tablet) with no relief.  She states the Hydroxyzine was prescribed 3x/day prn. She states she last took benadryl last night. She states she is also taking showers without relief.    Patient endorses fetal movement. She denies vaginal concerns including bleeding, discharge, or leaking.  Patient reports some cramping with coughing noting that she experiences some tightness in the upper abdominal area.    OB History     Gravida  2   Para  0   Term  0   Preterm  0   AB  1   Living  0      SAB  0   IAB  0   Ectopic  0   Multiple  0   Live Births  0           Past Medical History:  Diagnosis Date   Diabetes mellitus without complication (HCC)    Patient desires pregnancy 09/30/2019    Past Surgical History:  Procedure Laterality Date   dental implants     DENTAL RESTORATION/EXTRACTION WITH X-RAY     TYMPANOSTOMY TUBE PLACEMENT      Family History  Problem Relation Age of Onset   Asthma Sister    Seizures Sister    Cancer Paternal Grandmother        breast   COPD Paternal Grandfather     Social History   Tobacco Use   Smoking status: Never   Smokeless tobacco: Never  Vaping Use   Vaping status: Never Used  Substance Use Topics   Alcohol use: Not Currently    Comment: occ   Drug use: No    Allergies:  Allergies  Allergen Reactions   Lexapro [Escitalopram] Other (See Comments)    Racing thoughts; did not like how it made her feel    Other Rash    Adhesive tape    No medications prior to admission.    Review of Systems  Gastrointestinal:  Negative for nausea and vomiting.  Genitourinary:  Negative for difficulty urinating, dysuria, vaginal bleeding and vaginal discharge.  Skin:        Itching   Physical Exam   Blood pressure 122/73, pulse 81, temperature 98.8 F (37.1 C), temperature source Oral, resp. rate 19, height 5' 2.5" (1.588 m), weight 99.6 kg, last menstrual period 07/08/2022, SpO2 98%.  Physical Exam Vitals and nursing note reviewed.  Constitutional:      Appearance: Normal appearance.  HENT:     Head: Normocephalic and atraumatic.  Eyes:     Conjunctiva/sclera: Conjunctivae normal.  Cardiovascular:     Rate and Rhythm: Normal rate.  Abdominal:     Comments: Gravid, Appears AGA  Musculoskeletal:        General: Normal range of motion.     Cervical back: Normal range of motion.  Neurological:     Mental Status: She is alert and oriented to person, place, and time.  Psychiatric:        Mood and Affect: Mood normal.        Behavior: Behavior normal.     Fetal Assessment 145 bpm, Mod Var, -Decels, +Accels Toco: Irritability   MAU Course   Results for orders placed or performed during the hospital encounter of 06/16/23 (from the past 24 hour(s))  Urinalysis, Routine w reflex microscopic -Urine, Clean Catch     Status: Abnormal   Collection Time: 06/16/23  6:34 AM  Result Value Ref Range   Color, Urine YELLOW YELLOW   APPearance HAZY (A) CLEAR   Specific Gravity, Urine 1.019 1.005 - 1.030   pH 6.0 5.0 - 8.0   Glucose, UA NEGATIVE NEGATIVE mg/dL   Hgb urine dipstick NEGATIVE NEGATIVE   Bilirubin Urine NEGATIVE NEGATIVE   Ketones, ur NEGATIVE NEGATIVE mg/dL   Protein, ur NEGATIVE NEGATIVE mg/dL   Nitrite NEGATIVE NEGATIVE   Leukocytes,Ua NEGATIVE NEGATIVE  Comprehensive metabolic panel     Status: Abnormal   Collection Time: 06/16/23  7:15 AM  Result Value Ref Range   Sodium 137  135 - 145 mmol/L   Potassium 4.3 3.5 - 5.1 mmol/L   Chloride 106 98 - 111 mmol/L   CO2 20 (L) 22 - 32 mmol/L   Glucose, Bld 85 70 - 99 mg/dL   BUN 15 6 - 20 mg/dL   Creatinine, Ser 1.61 0.44 - 1.00 mg/dL   Calcium 8.9 8.9 - 09.6 mg/dL   Total Protein 6.2 (L) 6.5 - 8.1 g/dL   Albumin 2.2 (L) 3.5 - 5.0 g/dL   AST 57 (H) 15 - 41 U/L   ALT 73 (H) 0 - 44 U/L   Alkaline Phosphatase 204 (H) 38 - 126 U/L   Total Bilirubin 0.9 <1.2 mg/dL   GFR, Estimated >04 >54 mL/min   Anion gap 11 5 - 15   No results found.  MDM PE Labs: CMP EFM Antihistamine  Assessment and Plan  27 year old G2P0010  SIUP at 36.1 weeks Cat I FT Pruritus concerning for ICP   -POC Reviewed.  -Exam performed. -Informed that treatment for itching is limited.  -Discussed taking hydroxyzine TID and utilizing topical creams lotions as needed. -Encouraged to avoid hot showers as this can make symptoms worse.  -Reviewed delay in return of bile acids and will defer since she already has one in process from Thursday.  -Discussed collecting CMP to evaluate hepatic function and if abnormal will start on Actigall. -If normal will discharge to home with follow up as scheduled. -Patient reassured that her having DM does not change this plan. -Patient without other questions or concerns.   Hessie Dibble MSN, CNM 06/16/2023, 9:15 AM   Reassessment (9:15 AM) Report given to C. Judd Lien, MD who assumes care.  Hessie Dibble MSN, CNM Advanced Practice Provider, [facility]  Assumed care pending LFTs, if elevated start on Ursodiol 300mg  BID for presumed ICP (BA drew in clinic 06/14/23, pending results).  LFTs slightly elevated at 57/73.  Clarified with Dr. Amado Nash and given her symptomatology will go ahead and start on Ursodiol.  Dr. Amado Nash was able to find her BA results from clinic and reported them as stilling pending but that she was dx'ed with ICP and started on Atarax TID.  Given first dose of Ursodiol  in MAU, tolerated well, stable for d/c.    NST 130bpm, moderate variability, +accels, no decels, no contractions, Cat 1 prior to d/c   Mittie Bodo, MD  Family Medicine - Obstetrics Fellow

## 2023-06-19 ENCOUNTER — Encounter (HOSPITAL_COMMUNITY): Payer: Self-pay

## 2023-06-19 ENCOUNTER — Other Ambulatory Visit: Payer: Self-pay | Admitting: Obstetrics and Gynecology

## 2023-06-19 NOTE — Patient Instructions (Signed)
Joan Gonzalez  06/19/2023   Your procedure is scheduled on:  06/22/2023  Arrive at 0600 at Entrance C on [address] at [facility]  and [employer]. You are invited to use the FREE valet parking or use the Visitor's parking deck.  Pick up the phone at the desk and dial [phone].  Call this number if you have problems the morning of surgery: [phone]  Remember:   Do not eat food:(After Midnight) Desps de medianoche.  You may drink clear liquids until arrival at __0600___.  Clear liquids means a liquid you can see thru.  It can have color such as Cola or Kool aid.  Tea is OK and coffee as long as no milk or creamer of any kind.  Take these medicines the morning of surgery with A SIP OF WATER:  Take ursodiol as prescribed.  Continue insulin pump as Endocrinologist prescscribes.  Contact your endocrinologist for new pump settings after delivery.   Do not wear jewelry, make-up or nail polish.  Do not wear lotions, powders, or perfumes. Do not wear deodorant.  Do not shave 48 hours prior to surgery.  Do not bring valuables to the hospital.  [facility] is not   responsible for any belongings or valuables brought to the hospital.  Contacts, dentures or bridgework may not be worn into surgery.  Leave suitcase in the car. After surgery it may be brought to your room.  For patients admitted to the hospital, checkout time is 11:00 AM the day of              discharge.      Please read over the following fact sheets that you were given:     Preparing for Surgery

## 2023-06-20 ENCOUNTER — Encounter (HOSPITAL_COMMUNITY)
Admission: RE | Admit: 2023-06-20 | Discharge: 2023-06-20 | Disposition: A | Discharge status: Home / Self Care | Payer: No Typology Code available for payment source | Source: Ambulatory Visit | Attending: Obstetrics and Gynecology | Admitting: Obstetrics and Gynecology

## 2023-06-20 DIAGNOSIS — Z01812 Encounter for preprocedural laboratory examination: Secondary | ICD-10-CM | POA: Insufficient documentation

## 2023-06-20 HISTORY — DX: Intrahepatic cholestasis of pregnancy, unspecified trimester: O26.649

## 2023-06-20 LAB — CBC
HCT: 36.2 % (ref 36.0–46.0)
Hemoglobin: 12.3 g/dL (ref 12.0–15.0)
MCH: 29.4 pg (ref 26.0–34.0)
MCHC: 34 g/dL (ref 30.0–36.0)
MCV: 86.4 fL (ref 80.0–100.0)
Platelets: 343 10*3/uL (ref 150–400)
RBC: 4.19 MIL/uL (ref 3.87–5.11)
RDW: 12.8 % (ref 11.5–15.5)
WBC: 9.1 10*3/uL (ref 4.0–10.5)
nRBC: 0 % (ref 0.0–0.2)

## 2023-06-20 LAB — TYPE AND SCREEN
ABO/RH(D): O POS
Antibody Screen: NEGATIVE

## 2023-06-20 LAB — RPR: RPR Ser Ql: NONREACTIVE

## 2023-06-21 NOTE — H&P (Addendum)
Joan Gonzalez is a 26 y.o. female presenting for primary csection due to oligo, severe ICP, IDDM and unengaged vertex , declines vaginal delivery attempt. OB History     Gravida  2   Para  0   Term  0   Preterm  0   AB  1   Living  0      SAB  0   IAB  0   Ectopic  0   Multiple  0   Live Births  0          Past Medical History:  Diagnosis Date   Cholestasis during pregnancy    Diabetes mellitus without complication (HCC)    Patient desires pregnancy 09/30/2019   Past Surgical History:  Procedure Laterality Date   dental implants     DENTAL RESTORATION/EXTRACTION WITH X-RAY     TYMPANOSTOMY TUBE PLACEMENT     Family History: family history includes Asthma in her sister; COPD in her paternal grandfather; Cancer in her paternal grandmother; Seizures in her sister. Social History:  reports that she has never smoked. She has never used smokeless tobacco. She reports that she does not currently use alcohol. She reports that she does not use drugs.     Maternal Diabetes: Yes:  Diabetes Type:  Pre-pregnancy, Insulin/Medication controlled Genetic Screening: Normal Maternal Ultrasounds/Referrals: Normal Fetal Ultrasounds or other Referrals:  Fetal echo Maternal Substance Abuse:  No Significant Maternal Medications:  Meds include: Other: insulin, ursodiol Significant Maternal Lab Results:  Group B Strep negative Number of Prenatal Visits:greater than 3 verified prenatal visits Maternal Vaccinations:TDap Other Comments:  None  Review of Systems  Constitutional: Negative.   All other systems reviewed and are negative.  Maternal Medical History:  Reason for admission: Contractions.   Contractions: Frequency: rare.   Perceived severity is mild.   Fetal activity: Perceived fetal activity is normal.   Last perceived fetal movement was within the past hour.   Prenatal complications: Oligohydramnios.   Prenatal Complications - Diabetes: type 1. Diabetes is  managed by insulin pump.       Last menstrual period 07/08/2022. Maternal Exam:  Uterine Assessment: Contraction strength is mild.  Contraction frequency is rare.  Abdomen: Patient reports no abdominal tenderness. Fetal presentation: vertex Introitus: Normal vulva. Normal vagina.  Ferning test: not done.  Nitrazine test: not done. Pelvis: questionable for delivery.     Physical Exam Constitutional:      Appearance: Normal appearance. She is normal weight.  HENT:     Head: Normocephalic and atraumatic.  Cardiovascular:     Rate and Rhythm: Normal rate and regular rhythm.     Heart sounds: Normal heart sounds.  Pulmonary:     Effort: Pulmonary effort is normal.     Breath sounds: Normal breath sounds.  Abdominal:     General: Bowel sounds are normal.     Palpations: Abdomen is soft.  Genitourinary:    General: Normal vulva.  Musculoskeletal:        General: Normal range of motion.     Cervical back: Neck supple.  Skin:    General: Skin is warm and dry.  Neurological:     General: No focal deficit present.     Mental Status: She is alert and oriented to person, place, and time.  Psychiatric:        Mood and Affect: Mood normal.        Behavior: Behavior normal.     Prenatal labs: ABO, Rh: --/--/O POS (12/11  1610) Antibody: NEG (12/11 0949) Rubella: Immune (06/13 0000) RPR: NON REACTIVE (12/11 9604)  HBsAg: Negative (06/13 0000)  HIV: Non-reactive (06/13 0000)  GBS: Positive/-- (12/03 0000)   Assessment/Plan: 37wk IUP IDDM on insulin pump Cholestasis of pregnancy Oligo Unengaged fetal vertex- declines iol Primary csection. Risks of anesthesia , infection, bleeding with injury to surrounding organs with need for repair discussed. Early term delivery indicated due to cholestasis, Bile acids 70, unrelenting pruritus on ursodiol.Consent done.  Prospero Mahnke J 06/21/2023, 8:24 PM

## 2023-06-21 NOTE — Anesthesia Preprocedure Evaluation (Addendum)
Anesthesia Evaluation  Patient identified by MRN, date of birth, ID band Patient awake    Reviewed: Allergy & Precautions, NPO status , Patient's Chart, lab work & pertinent test results  History of Anesthesia Complications Negative for: history of anesthetic complications  Airway Mallampati: II  TM Distance: >3 FB Neck ROM: Full    Dental no notable dental hx.    Pulmonary neg pulmonary ROS   Pulmonary exam normal        Cardiovascular negative cardio ROS Normal cardiovascular exam     Neuro/Psych negative neurological ROS  negative psych ROS   GI/Hepatic negative GI ROS,,,Cholestasis   Endo/Other  diabetes, Type 1, Insulin Dependent  Class 3 obesity  Renal/GU negative Renal ROS  negative genitourinary   Musculoskeletal negative musculoskeletal ROS (+)    Abdominal   Peds  Hematology negative hematology ROS (+)   Anesthesia Other Findings Day of surgery medications reviewed with patient.  Reproductive/Obstetrics (+) Pregnancy                              Anesthesia Physical Anesthesia Plan  ASA: 3  Anesthesia Plan: Spinal   Post-op Pain Management: Ofirmev IV (intra-op)*   Induction:   PONV Risk Score and Plan: 4 or greater and Treatment may vary due to age or medical condition, Ondansetron and Dexamethasone  Airway Management Planned: Natural Airway  Additional Equipment: None  Intra-op Plan:   Post-operative Plan:   Informed Consent: I have reviewed the patients History and Physical, chart, labs and discussed the procedure including the risks, benefits and alternatives for the proposed anesthesia with the patient or authorized representative who has indicated his/her understanding and acceptance.       Plan Discussed with: CRNA  Anesthesia Plan Comments:         Anesthesia Quick Evaluation

## 2023-06-22 ENCOUNTER — Encounter (HOSPITAL_COMMUNITY)
Admission: AD | Disposition: A | Discharge status: Home / Self Care | Payer: Self-pay | Source: Home / Self Care | Attending: Obstetrics and Gynecology

## 2023-06-22 ENCOUNTER — Inpatient Hospital Stay (HOSPITAL_COMMUNITY): Payer: No Typology Code available for payment source | Admitting: Anesthesiology

## 2023-06-22 ENCOUNTER — Inpatient Hospital Stay (HOSPITAL_COMMUNITY)
Admission: AD | Admit: 2023-06-22 | Discharge: 2023-06-24 | DRG: 786 | Disposition: A | Discharge status: Home / Self Care | Payer: No Typology Code available for payment source | Attending: Obstetrics and Gynecology | Admitting: Obstetrics and Gynecology

## 2023-06-22 ENCOUNTER — Encounter (HOSPITAL_COMMUNITY): Payer: Self-pay | Admitting: Obstetrics and Gynecology

## 2023-06-22 ENCOUNTER — Other Ambulatory Visit: Payer: Self-pay

## 2023-06-22 DIAGNOSIS — F419 Anxiety disorder, unspecified: Secondary | ICD-10-CM | POA: Diagnosis present

## 2023-06-22 DIAGNOSIS — D62 Acute posthemorrhagic anemia: Secondary | ICD-10-CM | POA: Diagnosis not present

## 2023-06-22 DIAGNOSIS — F32A Depression, unspecified: Secondary | ICD-10-CM | POA: Diagnosis present

## 2023-06-22 DIAGNOSIS — E66813 Obesity, class 3: Secondary | ICD-10-CM | POA: Diagnosis present

## 2023-06-22 DIAGNOSIS — Z01812 Encounter for preprocedural laboratory examination: Secondary | ICD-10-CM

## 2023-06-22 DIAGNOSIS — O24113 Pre-existing diabetes mellitus, type 2, in pregnancy, third trimester: Secondary | ICD-10-CM | POA: Diagnosis not present

## 2023-06-22 DIAGNOSIS — Z794 Long term (current) use of insulin: Secondary | ICD-10-CM

## 2023-06-22 DIAGNOSIS — E109 Type 1 diabetes mellitus without complications: Secondary | ICD-10-CM | POA: Diagnosis present

## 2023-06-22 DIAGNOSIS — L299 Pruritus, unspecified: Secondary | ICD-10-CM | POA: Diagnosis present

## 2023-06-22 DIAGNOSIS — O2402 Pre-existing diabetes mellitus, type 1, in childbirth: Secondary | ICD-10-CM | POA: Diagnosis present

## 2023-06-22 DIAGNOSIS — O9081 Anemia of the puerperium: Secondary | ICD-10-CM | POA: Diagnosis not present

## 2023-06-22 DIAGNOSIS — O26649 Intrahepatic cholestasis of pregnancy, unspecified trimester: Secondary | ICD-10-CM | POA: Diagnosis present

## 2023-06-22 DIAGNOSIS — K7689 Other specified diseases of liver: Secondary | ICD-10-CM | POA: Diagnosis present

## 2023-06-22 DIAGNOSIS — O26643 Intrahepatic cholestasis of pregnancy, third trimester: Secondary | ICD-10-CM | POA: Diagnosis present

## 2023-06-22 DIAGNOSIS — E7879 Other disorders of bile acid and cholesterol metabolism: Secondary | ICD-10-CM | POA: Diagnosis present

## 2023-06-22 DIAGNOSIS — O99214 Obesity complicating childbirth: Secondary | ICD-10-CM | POA: Diagnosis present

## 2023-06-22 DIAGNOSIS — Z3A37 37 weeks gestation of pregnancy: Secondary | ICD-10-CM

## 2023-06-22 DIAGNOSIS — Z23 Encounter for immunization: Secondary | ICD-10-CM

## 2023-06-22 DIAGNOSIS — O329XX0 Maternal care for malpresentation of fetus, unspecified, not applicable or unspecified: Secondary | ICD-10-CM | POA: Diagnosis present

## 2023-06-22 DIAGNOSIS — Z98891 History of uterine scar from previous surgery: Secondary | ICD-10-CM

## 2023-06-22 DIAGNOSIS — Z9641 Presence of insulin pump (external) (internal): Secondary | ICD-10-CM | POA: Diagnosis present

## 2023-06-22 DIAGNOSIS — O4103X0 Oligohydramnios, third trimester, not applicable or unspecified: Secondary | ICD-10-CM | POA: Diagnosis present

## 2023-06-22 DIAGNOSIS — O26893 Other specified pregnancy related conditions, third trimester: Secondary | ICD-10-CM | POA: Diagnosis present

## 2023-06-22 LAB — GLUCOSE, CAPILLARY
Glucose-Capillary: 129 mg/dL — ABNORMAL HIGH (ref 70–99)
Glucose-Capillary: 169 mg/dL — ABNORMAL HIGH (ref 70–99)
Glucose-Capillary: 112 mg/dL — ABNORMAL HIGH (ref 70–99)

## 2023-06-22 SURGERY — Surgical Case
Anesthesia: Spinal

## 2023-06-22 MED ORDER — CEFAZOLIN SODIUM-DEXTROSE 2-4 GM/100ML-% IV SOLN
INTRAVENOUS | Status: AC
  Filled 2023-06-22: qty 100

## 2023-06-22 MED ORDER — KETOROLAC TROMETHAMINE 30 MG/ML IJ SOLN
INTRAMUSCULAR | Status: AC
  Filled 2023-06-22: qty 1

## 2023-06-22 MED ORDER — ACETAMINOPHEN 500 MG PO TABS: 1000.0000 mg | ORAL_TABLET | Freq: Once | ORAL | Status: DC

## 2023-06-22 MED ORDER — DIBUCAINE (PERIANAL) 1 % EX OINT: 1.0000 | TOPICAL_OINTMENT | CUTANEOUS | Status: DC | PRN

## 2023-06-22 MED ORDER — MENTHOL 3 MG MT LOZG: 1.0000 | LOZENGE | OROMUCOSAL | Status: DC | PRN

## 2023-06-22 MED ORDER — KETOROLAC TROMETHAMINE 30 MG/ML IJ SOLN: 30.0000 mg | Freq: Four times a day (QID) | INTRAMUSCULAR | Status: AC | PRN

## 2023-06-22 MED ORDER — WITCH HAZEL-GLYCERIN EX PADS: 1.0000 | MEDICATED_PAD | CUTANEOUS | Status: DC | PRN

## 2023-06-22 MED ORDER — MORPHINE SULFATE (PF) 0.5 MG/ML IJ SOLN
INTRAMUSCULAR | Status: DC | PRN
  Administered 2023-06-22: 150 ug via INTRATHECAL

## 2023-06-22 MED ORDER — BUPIVACAINE LIPOSOME 1.3 % IJ SUSP
INTRAMUSCULAR | Status: DC | PRN
  Administered 2023-06-22: 20 mL

## 2023-06-22 MED ORDER — PRENATAL MULTIVITAMIN CH
1.0000 | ORAL_TABLET | Freq: Every day | ORAL | Status: DC
  Administered 2023-06-23 – 2023-06-24 (×2): 1 via ORAL
  Filled 2023-06-22 (×2): qty 1

## 2023-06-22 MED ORDER — DIPHENHYDRAMINE HCL 50 MG/ML IJ SOLN
INTRAMUSCULAR | Status: AC
  Filled 2023-06-22: qty 1

## 2023-06-22 MED ORDER — ACETAMINOPHEN 160 MG/5ML PO SOLN: 1000.0000 mg | Freq: Once | ORAL | Status: DC

## 2023-06-22 MED ORDER — POVIDONE-IODINE 10 % EX SWAB
2.0000 | Freq: Once | CUTANEOUS | Status: AC
  Administered 2023-06-22: 2 via TOPICAL

## 2023-06-22 MED ORDER — ACETAMINOPHEN 500 MG PO TABS
1000.0000 mg | ORAL_TABLET | Freq: Four times a day (QID) | ORAL | Status: DC
  Administered 2023-06-22 – 2023-06-24 (×8): 1000 mg via ORAL
  Filled 2023-06-22 (×9): qty 2

## 2023-06-22 MED ORDER — ACETAMINOPHEN 10 MG/ML IV SOLN
INTRAVENOUS | Status: AC
  Filled 2023-06-22: qty 100

## 2023-06-22 MED ORDER — FENTANYL CITRATE (PF) 100 MCG/2ML IJ SOLN
INTRAMUSCULAR | Status: DC | PRN
  Administered 2023-06-22: 15 ug via INTRATHECAL

## 2023-06-22 MED ORDER — PHENYLEPHRINE HCL-NACL 20-0.9 MG/250ML-% IV SOLN
INTRAVENOUS | Status: AC
  Filled 2023-06-22: qty 250

## 2023-06-22 MED ORDER — OXYTOCIN-SODIUM CHLORIDE 30-0.9 UT/500ML-% IV SOLN
2.5000 [IU]/h | INTRAVENOUS | Status: DC
  Administered 2023-06-22: 2.5 [IU]/h via INTRAVENOUS
  Filled 2023-06-22: qty 500

## 2023-06-22 MED ORDER — DEXAMETHASONE SODIUM PHOSPHATE 10 MG/ML IJ SOLN
INTRAMUSCULAR | Status: AC
  Filled 2023-06-22: qty 1

## 2023-06-22 MED ORDER — BUPIVACAINE LIPOSOME 1.3 % IJ SUSP
INTRAMUSCULAR | Status: AC
  Filled 2023-06-22: qty 20

## 2023-06-22 MED ORDER — ONDANSETRON HCL 4 MG/2ML IJ SOLN
4.0000 mg | Freq: Three times a day (TID) | INTRAMUSCULAR | Status: DC | PRN
  Administered 2023-06-23: 4 mg via INTRAVENOUS
  Filled 2023-06-22: qty 2

## 2023-06-22 MED ORDER — FENTANYL CITRATE (PF) 100 MCG/2ML IJ SOLN: 25.0000 ug | INTRAMUSCULAR | Status: DC | PRN

## 2023-06-22 MED ORDER — METHYLERGONOVINE MALEATE 0.2 MG PO TABS: 0.2000 mg | ORAL_TABLET | ORAL | Status: DC | PRN

## 2023-06-22 MED ORDER — ACETAMINOPHEN 10 MG/ML IV SOLN
INTRAVENOUS | Status: DC | PRN
  Administered 2023-06-22: 1000 mg via INTRAVENOUS

## 2023-06-22 MED ORDER — ONDANSETRON HCL 4 MG/2ML IJ SOLN
INTRAMUSCULAR | Status: AC
  Filled 2023-06-22: qty 2

## 2023-06-22 MED ORDER — DIPHENHYDRAMINE HCL 25 MG PO CAPS: 25.0000 mg | ORAL_CAPSULE | ORAL | Status: DC | PRN

## 2023-06-22 MED ORDER — NALOXONE HCL 4 MG/10ML IJ SOLN: 1.0000 ug/kg/h | INTRAVENOUS | Status: DC | PRN

## 2023-06-22 MED ORDER — BUPIVACAINE IN DEXTROSE 0.75-8.25 % IT SOLN
INTRATHECAL | Status: DC | PRN
  Administered 2023-06-22: 1.6 mL via INTRATHECAL

## 2023-06-22 MED ORDER — SODIUM CHLORIDE 0.9% FLUSH: 3.0000 mL | INTRAVENOUS | Status: DC | PRN

## 2023-06-22 MED ORDER — BUPIVACAINE HCL (PF) 0.25 % IJ SOLN
INTRAMUSCULAR | Status: DC | PRN
  Administered 2023-06-22: 20 mL

## 2023-06-22 MED ORDER — DROPERIDOL 2.5 MG/ML IJ SOLN: 0.6250 mg | Freq: Once | INTRAMUSCULAR | Status: DC | PRN

## 2023-06-22 MED ORDER — ACETAMINOPHEN 500 MG PO TABS: 1000.0000 mg | ORAL_TABLET | Freq: Four times a day (QID) | ORAL | Status: DC

## 2023-06-22 MED ORDER — ZOLPIDEM TARTRATE 5 MG PO TABS: 5.0000 mg | ORAL_TABLET | Freq: Every evening | ORAL | Status: DC | PRN

## 2023-06-22 MED ORDER — SODIUM CHLORIDE (PF) 0.9 % IJ SOLN
INTRAMUSCULAR | Status: AC
  Filled 2023-06-22: qty 100

## 2023-06-22 MED ORDER — INSULIN PUMP
SUBCUTANEOUS | Status: DC
  Filled 2023-06-22: qty 1

## 2023-06-22 MED ORDER — NALOXONE HCL 0.4 MG/ML IJ SOLN: 0.4000 mg | INTRAMUSCULAR | Status: DC | PRN

## 2023-06-22 MED ORDER — CEFAZOLIN SODIUM-DEXTROSE 2-4 GM/100ML-% IV SOLN
2.0000 g | INTRAVENOUS | Status: AC
  Administered 2023-06-22: 2 g via INTRAVENOUS

## 2023-06-22 MED ORDER — PHENYLEPHRINE HCL-NACL 20-0.9 MG/250ML-% IV SOLN
INTRAVENOUS | Status: DC | PRN
  Administered 2023-06-22: 60 ug/min via INTRAVENOUS

## 2023-06-22 MED ORDER — CARBOPROST TROMETHAMINE 250 MCG/ML IM SOLN
INTRAMUSCULAR | Status: AC
  Filled 2023-06-22: qty 1

## 2023-06-22 MED ORDER — KETOROLAC TROMETHAMINE 30 MG/ML IJ SOLN
30.0000 mg | Freq: Four times a day (QID) | INTRAMUSCULAR | Status: AC | PRN
  Filled 2023-06-22: qty 1

## 2023-06-22 MED ORDER — LACTATED RINGERS IV SOLN: INTRAVENOUS | Status: DC

## 2023-06-22 MED ORDER — DIPHENHYDRAMINE HCL 25 MG PO CAPS
25.0000 mg | ORAL_CAPSULE | Freq: Four times a day (QID) | ORAL | Status: DC | PRN
  Administered 2023-06-22: 25 mg via ORAL
  Filled 2023-06-22: qty 1

## 2023-06-22 MED ORDER — SCOPOLAMINE 1 MG/3DAYS TD PT72
MEDICATED_PATCH | TRANSDERMAL | Status: AC
  Filled 2023-06-22: qty 1

## 2023-06-22 MED ORDER — KETOROLAC TROMETHAMINE 30 MG/ML IJ SOLN: 30.0000 mg | Freq: Once | INTRAMUSCULAR | Status: DC

## 2023-06-22 MED ORDER — COCONUT OIL OIL: 1.0000 | TOPICAL_OIL | Status: DC | PRN

## 2023-06-22 MED ORDER — PHENYLEPHRINE 80 MCG/ML (10ML) SYRINGE FOR IV PUSH (FOR BLOOD PRESSURE SUPPORT)
PREFILLED_SYRINGE | INTRAVENOUS | Status: AC
  Filled 2023-06-22: qty 10

## 2023-06-22 MED ORDER — STERILE WATER FOR IRRIGATION IR SOLN
Status: DC | PRN
  Administered 2023-06-22: 1000 mL

## 2023-06-22 MED ORDER — FENTANYL CITRATE (PF) 100 MCG/2ML IJ SOLN
INTRAMUSCULAR | Status: AC
  Filled 2023-06-22: qty 2

## 2023-06-22 MED ORDER — SIMETHICONE 80 MG PO CHEW: 80.0000 mg | CHEWABLE_TABLET | ORAL | Status: DC | PRN

## 2023-06-22 MED ORDER — OXYCODONE HCL 5 MG PO TABS: 5.0000 mg | ORAL_TABLET | ORAL | Status: DC | PRN

## 2023-06-22 MED ORDER — DIPHENHYDRAMINE HCL 50 MG/ML IJ SOLN
12.5000 mg | INTRAMUSCULAR | Status: DC | PRN
  Administered 2023-06-22: 12.5 mg via INTRAVENOUS
  Filled 2023-06-22: qty 1

## 2023-06-22 MED ORDER — MORPHINE SULFATE (PF) 0.5 MG/ML IJ SOLN
INTRAMUSCULAR | Status: AC
  Filled 2023-06-22: qty 10

## 2023-06-22 MED ORDER — ONDANSETRON HCL 4 MG/2ML IJ SOLN
INTRAMUSCULAR | Status: DC | PRN
  Administered 2023-06-22: 4 mg via INTRAVENOUS

## 2023-06-22 MED ORDER — KETOROLAC TROMETHAMINE 30 MG/ML IJ SOLN
30.0000 mg | Freq: Four times a day (QID) | INTRAMUSCULAR | Status: AC
  Administered 2023-06-22 – 2023-06-23 (×4): 30 mg via INTRAVENOUS
  Filled 2023-06-22 (×4): qty 1

## 2023-06-22 MED ORDER — BUPIVACAINE HCL (PF) 0.25 % IJ SOLN
INTRAMUSCULAR | Status: AC
  Filled 2023-06-22: qty 20

## 2023-06-22 MED ORDER — OXYTOCIN-SODIUM CHLORIDE 30-0.9 UT/500ML-% IV SOLN
INTRAVENOUS | Status: DC | PRN
  Administered 2023-06-22: 300 mL via INTRAVENOUS

## 2023-06-22 MED ORDER — METHYLERGONOVINE MALEATE 0.2 MG/ML IJ SOLN: 0.2000 mg | INTRAMUSCULAR | Status: DC | PRN

## 2023-06-22 MED ORDER — KETOROLAC TROMETHAMINE 30 MG/ML IJ SOLN
INTRAMUSCULAR | Status: DC | PRN
  Administered 2023-06-22: 30 mg via INTRAVENOUS

## 2023-06-22 MED ORDER — SIMETHICONE 80 MG PO CHEW
80.0000 mg | CHEWABLE_TABLET | Freq: Three times a day (TID) | ORAL | Status: DC
  Administered 2023-06-22 – 2023-06-24 (×7): 80 mg via ORAL
  Filled 2023-06-22 (×7): qty 1

## 2023-06-22 MED ORDER — SENNOSIDES-DOCUSATE SODIUM 8.6-50 MG PO TABS
2.0000 | ORAL_TABLET | Freq: Every day | ORAL | Status: DC
  Administered 2023-06-23 – 2023-06-24 (×2): 2 via ORAL
  Filled 2023-06-22 (×2): qty 2

## 2023-06-22 MED ORDER — OXYTOCIN-SODIUM CHLORIDE 30-0.9 UT/500ML-% IV SOLN
INTRAVENOUS | Status: AC
  Filled 2023-06-22: qty 500

## 2023-06-22 MED ORDER — URSODIOL 300 MG PO CAPS
300.0000 mg | ORAL_CAPSULE | Freq: Every day | ORAL | Status: DC
  Administered 2023-06-22 – 2023-06-24 (×3): 300 mg via ORAL
  Filled 2023-06-22 (×3): qty 1

## 2023-06-22 MED ORDER — IBUPROFEN 600 MG PO TABS
600.0000 mg | ORAL_TABLET | Freq: Four times a day (QID) | ORAL | Status: DC
  Administered 2023-06-23 – 2023-06-24 (×4): 600 mg via ORAL
  Filled 2023-06-22 (×4): qty 1

## 2023-06-22 SURGICAL SUPPLY — 32 items
BENZOIN TINCTURE PRP APPL 2/3 (GAUZE/BANDAGES/DRESSINGS) IMPLANT
CHLORAPREP W/TINT 26 (MISCELLANEOUS) ×2 IMPLANT
CLAMP UMBILICAL CORD (MISCELLANEOUS) ×1 IMPLANT
CLOTH BEACON ORANGE TIMEOUT ST (SAFETY) ×1 IMPLANT
DRSG OPSITE POSTOP 4X10 (GAUZE/BANDAGES/DRESSINGS) ×1 IMPLANT
ELECT REM PT RETURN 9FT ADLT (ELECTROSURGICAL) ×1
ELECTRODE REM PT RTRN 9FT ADLT (ELECTROSURGICAL) ×1 IMPLANT
EXTRACTOR VACUUM KIWI (MISCELLANEOUS) IMPLANT
GLOVE BIOGEL PI IND STRL 7.0 (GLOVE) ×1 IMPLANT
GLOVE SURG LTX SZ8 (GLOVE) ×1 IMPLANT
GOWN STRL REUS W/TWL LRG LVL3 (GOWN DISPOSABLE) ×2 IMPLANT
KIT ABG SYR 3ML LUER SLIP (SYRINGE) IMPLANT
MAT PREVALON FULL STRYKER (MISCELLANEOUS) IMPLANT
NDL HYPO 25X5/8 SAFETYGLIDE (NEEDLE) IMPLANT
NDL SPNL 20GX3.5 QUINCKE YW (NEEDLE) IMPLANT
NEEDLE HYPO 22GX1.5 SAFETY (NEEDLE) ×1 IMPLANT
NEEDLE HYPO 25X5/8 SAFETYGLIDE (NEEDLE) IMPLANT
NEEDLE SPNL 20GX3.5 QUINCKE YW (NEEDLE) IMPLANT
NS IRRIG 1000ML POUR BTL (IV SOLUTION) ×1 IMPLANT
PACK C SECTION WH (CUSTOM PROCEDURE TRAY) ×1 IMPLANT
STRIP CLOSURE SKIN 1/2X4 (GAUZE/BANDAGES/DRESSINGS) IMPLANT
SUT MNCRL 0 VIOLET CTX 36 (SUTURE) ×2 IMPLANT
SUT MNCRL AB 3-0 PS2 27 (SUTURE) IMPLANT
SUT MON AB 2-0 CT1 27 (SUTURE) ×1 IMPLANT
SUT MON AB-0 CT1 36 (SUTURE) ×2 IMPLANT
SUT PLAIN 0 NONE (SUTURE) IMPLANT
SUT PLAIN 2 0 XLH (SUTURE) IMPLANT
SUT PLAIN ABS 2-0 CT1 27XMFL (SUTURE) IMPLANT
SYR CONTROL 10ML LL (SYRINGE) ×1 IMPLANT
TOWEL OR 17X24 6PK STRL BLUE (TOWEL DISPOSABLE) ×1 IMPLANT
TRAY FOLEY W/BAG SLVR 14FR LF (SET/KITS/TRAYS/PACK) ×1 IMPLANT
WATER STERILE IRR 1000ML POUR (IV SOLUTION) ×1 IMPLANT

## 2023-06-22 NOTE — Inpatient Diabetes Management (Signed)
Inpatient Diabetes Program Recommendations  AACE/ADA: New Consensus Statement on Inpatient Glycemic Control (2015)  Target Ranges:  Prepandial:   less than 140 mg/dL      Peak postprandial:   less than 180 mg/dL (1-2 hours)      Critically ill patients:  140 - 180 mg/dL    Latest Reference Range & Units 06/22/23 07:02 06/22/23 07:28 06/22/23 09:32  Glucose-Capillary 70 - 99 mg/dL 536 (H) 644 (H) 034 (H)  (H): Data is abnormally high    Admit with: CS for 37 week 0 day pregnancy   History: Type 1 diabetes (since age 26)  Home DM Meds: Tandem T slim Insulin Pump  Current Orders: Tandem T slim Insulin Pump     Met w/ pt at bedside in the PACU.  Pt A&O and able to independently manage her insulin pump.  Pt also stated her husband at bedside can help her as well.  Has extra Dexcom CGM supplies and extra Insulin pump supplies at bedside.  Sees Dr. Sharl Ma for diabetes management and insulin pump management.  Uses Tandem Tslim insulin pump.  Reviewed current pump settings with pt: Basal: 1.6 units/hr  Total Basal per 24H period= 38.4 units Correction Factor= 1:35 Carb Ratio: 1:2 Target CBG 110  Pt told me Dr. Sharl Ma gave her instructions for her pump after delivery.  We reviewed them together and Dr. Sharl Ma told pt to leave basal setting at current rate and allow the Control IQ mode to manage basal rates going forward.  Change Correction factor to 1:35 and Change Carb Ratio to 1:9.  When we looked at her pump, the Correction factor was already set at 1:35.  I watched pt change her Carb Ratio to 1:9.  Asked pt to please call RN if she has any symptoms of Hypoglycemia.  Also reminded pt that the RN will need to check fingerstick CBGs at least BID while in hospital--pt agreeable and appreciative of visit.    --Will follow patient during hospitalization--  Ambrose Finland RN, MSN, CDCES Diabetes Coordinator Inpatient Glycemic Control Team Team Pager: [phone] (8a-5p)

## 2023-06-22 NOTE — Op Note (Signed)
Cesarean Section Procedure Note  Indications: malpresentation: unengaged vertex, cholestasis, IDDM, oligohydramnios, and patient declines vag del attempt  Pre-operative Diagnosis: 37 week 0 day pregnancy.  Post-operative Diagnosis: same  Surgeon: Lenoard Aden   Assistants: Yetta Barre, CNM  Anesthesia: Local anesthesia 0.25.% bupivacaine and Spinal anesthesia  ASA Class: 2  Procedure Details  The patient was seen in the Holding Room. The risks, benefits, complications, treatment options, and expected outcomes were discussed with the patient.  The patient concurred with the proposed plan, giving informed consent. The risks of anesthesia, infection, bleeding and possible injury to other organs discussed. Injury to bowel, bladder, or ureter with possible need for repair discussed. Possible need for transfusion with secondary risks of hepatitis or HIV acquisition discussed. Post operative complications to include but not limited to DVT, PE and Pneumonia noted. The site of surgery properly noted/marked. The patient was taken to Operating Room # A, identified as Joan Gonzalez and the procedure verified as C-Section Delivery. A Time Out was held and the above information confirmed.  After induction of anesthesia, the patient was draped and prepped in the usual sterile manner. A Pfannenstiel incision was made and carried down through the subcutaneous tissue to the fascia. Fascial incision was made and extended transversely using Mayo scissors. The fascia was separated from the underlying rectus tissue superiorly and inferiorly. The peritoneum was identified and entered. Peritoneal incision was extended longitudinally. The utero-vesical peritoneal reflection was incised transversely and the bladder flap was bluntly freed from the lower uterine segment. A low transverse uterine incision(Kerr hysterotomy) was made. Delivered from floating vertex, oblique presentation was a  female with Apgar scores of 8 at  one minute and 9 at five minutes. Bulb suctioning gently performed. Neonatal team in attendance.After the umbilical cord was clamped and cut cord blood was obtained for evaluation. The placenta was removed intact and appeared normal. The uterus was curetted with a dry lap pack. Good hemostasis was noted.The uterine outline, tubes and ovaries appeared normal. The uterine incision was closed with running locked sutures of 0 Monocryl x 1 layers. Single interrupted suture in midline. Hemostasis was observed. The fascia was then reapproximated with running sutures of 0 Monocryl. The skin was reapproximated with 3-0 monocryl after [place] closure with 2-0 plain.  Instrument, sponge, and needle counts were correct prior the abdominal closure and at the conclusion of the case.   Findings: FTLF, as above. , fundal placenta. Nl adnexa  Estimated Blood Loss:   500         Drains: foley                 Specimens: placenta to LD                 Complications:  None; patient tolerated the procedure well.         Disposition: PACU - hemodynamically stable.         Condition: stable  Attending Attestation: I performed the procedure.

## 2023-06-22 NOTE — Transfer of Care (Signed)
Immediate Anesthesia Transfer of Care Note  Patient: Joan Gonzalez  Procedure(s) Performed: Primary CESAREAN SECTION  Patient Location: PACU  Anesthesia Type:Spinal  Level of Consciousness: awake, alert , and oriented  Airway & Oxygen Therapy: Patient Spontanous Breathing  Post-op Assessment: Report given to RN and Post -op Vital signs reviewed and stable  Post vital signs: Reviewed and stable  Last Vitals:  Vitals Value Taken Time  BP 126/81 06/22/23 0923  Temp    Pulse 72 06/22/23 0925  Resp    SpO2 99 % 06/22/23 0925  Vitals shown include unfiled device data.  Last Pain:  Vitals:   06/22/23 0636  TempSrc:   PainSc: 0-No pain         Complications: No notable events documented.

## 2023-06-22 NOTE — Anesthesia Procedure Notes (Signed)
Spinal  Patient location during procedure: OR Start time: 06/22/2023 8:10 AM End time: 06/22/2023 8:13 AM Reason for block: surgical anesthesia Staffing Performed: anesthesiologist  Anesthesiologist: Kaylyn Layer, MD Performed by: Kaylyn Layer, MD Authorized by: Kaylyn Layer, MD   Preanesthetic Checklist Completed: patient identified, IV checked, risks and benefits discussed, monitors and equipment checked, pre-op evaluation and timeout performed Spinal Block Patient position: sitting Prep: DuraPrep and site prepped and draped Patient monitoring: heart rate, continuous pulse ox and blood pressure Approach: midline Location: L3-4 Injection technique: single-shot Needle Needle type: Pencan  Needle gauge: 24 G Needle length: 10 cm Assessment Sensory level: T4 Events: CSF return Additional Notes Risks, benefits, and alternative discussed. Patient gave consent to procedure. Prepped and draped in sitting position. Clear CSF obtained after one needle pass. Positive terminal aspiration. No pain or paraesthesias with injection. Patient tolerated procedure well. Vital signs stable. Amalia Greenhouse, MD

## 2023-06-22 NOTE — Anesthesia Postprocedure Evaluation (Signed)
Anesthesia Post Note  Patient: Joan Gonzalez  Procedure(s) Performed: Primary CESAREAN SECTION     Patient location during evaluation: PACU Anesthesia Type: Spinal Level of consciousness: awake and alert Pain management: pain level controlled Vital Signs Assessment: post-procedure vital signs reviewed and stable Respiratory status: spontaneous breathing, nonlabored ventilation and respiratory function stable Cardiovascular status: blood pressure returned to baseline Postop Assessment: no apparent nausea or vomiting, spinal receding, no headache and no backache Anesthetic complications: no   No notable events documented.  Last Vitals:  Vitals:   06/22/23 1045 06/22/23 1051  BP:  115/60  Pulse: 83 73  Resp: 19   Temp:  36.7 C  SpO2: 98% 98%    Last Pain:  Vitals:   06/22/23 1051  TempSrc: Oral  PainSc:                  Shanda Howells

## 2023-06-22 NOTE — Lactation Note (Signed)
This note was copied from a baby's chart. Lactation Consultation Note  Patient Name: Joan Gonzalez [id number] Date: 06/22/2023 Age:28 hours Reason for consult: Initial assessment;Primapara;1st time breastfeeding;Breastfeeding assistance;Early term 37-38.6wks  P1, 37 weeks, 6 hours old. Infant in laid back positioning, per mom baby had just done 10 minutes. Adjusted to football positioning, Discussed demonstrated latching with hand expression to start. Discussed expectations of baby and mom learning to breast. Encouraged sleepy today- feed every 3 hours with feeding cues or wake baby as needed- Cluster feeding to bring in milk by tomorrow night very common. Once baby working well- mom takes over holds. Infant continues to work well for 15 minutes on breast- 25 minutes overall- with mom & LC estimated. Encouraged EBM or coconut oil on nipple after feed for nipple health. LC services,breast milk storage times, and pump cleaning shared with mom. Encouraged mom to call [facility] assist as desired.  Maternal Data Has patient been taught Hand Expression?: Yes Does the patient have breastfeeding experience prior to this delivery?: No  Feeding Mother's Current Feeding Choice: Breast Milk  LATCH Score Latch: Grasps breast easily, tongue down, lips flanged, rhythmical sucking.  Audible Swallowing: Spontaneous and intermittent  Type of Nipple: Everted at rest and after stimulation  Comfort (Breast/Nipple): Soft / non-tender  Hold (Positioning): Assistance needed to correctly position infant at breast and maintain latch.  LATCH Score: 9  Interventions Interventions: Breast feeding basics reviewed;Assisted with latch;Hand express;Breast compression;Support pillows;Adjust position;Expressed milk;Education;LC Services brochure;CDC Guidelines for Breast Pump Cleaning (Milk storage guidelines)  Discharge Pump: Hands Free;Personal  Consult Status Consult Status: Follow-up Date: 06/23/23 Follow-up  type: In-patient    [facility] 06/22/2023, 2:59 PM

## 2023-06-23 LAB — GLUCOSE, CAPILLARY: Glucose-Capillary: 102 mg/dL — ABNORMAL HIGH (ref 70–99)

## 2023-06-23 LAB — CBC
HCT: 28.3 % — ABNORMAL LOW (ref 36.0–46.0)
Hemoglobin: 9.6 g/dL — ABNORMAL LOW (ref 12.0–15.0)
MCH: 29.4 pg (ref 26.0–34.0)
MCHC: 33.9 g/dL (ref 30.0–36.0)
MCV: 86.8 fL (ref 80.0–100.0)
Platelets: 271 10*3/uL (ref 150–400)
RBC: 3.26 MIL/uL — ABNORMAL LOW (ref 3.87–5.11)
RDW: 12.8 % (ref 11.5–15.5)
WBC: 11 10*3/uL — ABNORMAL HIGH (ref 4.0–10.5)
nRBC: 0 % (ref 0.0–0.2)

## 2023-06-23 MED ORDER — INFLUENZA VIRUS VACC SPLIT PF (FLUZONE) 0.5 ML IM SUSY
0.5000 mL | PREFILLED_SYRINGE | INTRAMUSCULAR | Status: AC
  Administered 2023-06-24: 0.5 mL via INTRAMUSCULAR
  Filled 2023-06-23: qty 0.5

## 2023-06-23 NOTE — Lactation Note (Signed)
This note was copied from a baby's chart. Lactation Consultation Note  Patient Name: Joan Gonzalez [id number] Date: 06/23/2023 Age:28 hours.  Reason for consult: Follow-up assessment;Mother's request;Difficult latch;1st time breastfeeding;Late-preterm 34-36.6wks (weight loss -7.84%).  MOB requested latch assistance with LPTI, infant not been latching on her left breast. LC gave MOB pillow support, and MOB did reverse pressure softening prior to latching infant on her left breast. Infant BF for 9 minutes and afterwards FOB supplemented infant with 19 mm of 22 kcal formula while MOB used the DEBP and MOB expressed 5 mls of colostrum that she will offer after latching infant at the breast for the next feeding. MOB knows that her EBM is safe for 4 hours whereas formula must be used within 1 hour. LC reviewed LPTI feeding guidelines and reference the "MY Care Plan" in infant's basinet. Dad changed a stool while LC was in the room.  Today's Feeding Plan: 1- MOB will continue to follow LPTI feeding guidelines,latch infant every 3 hours, and limit chest ( breastfeeding) to 15 minutes or less, and total feeding chest and bottle 30 minutes or less. 2- MOB will continue to supplement infant with EBM and formula Day 2 (18-21 mls) per feeding or offer more if infant wants it. 3- MOB will continue to pump every 3 hours for 15 minutes  and offer her EBM first and then formula.   Maternal Data    Feeding Mother's Current Feeding Choice: Breast Milk and Formula Nipple Type: Slow - flow  LATCH Score Latch: Repeated attempts needed to sustain latch, nipple held in mouth throughout feeding, stimulation needed to elicit sucking reflex.  Audible Swallowing: A few with stimulation  Type of Nipple: Flat (MOB breast are very compressible and responds well to stimulation.)  Comfort (Breast/Nipple): Soft / non-tender  Hold (Positioning): Assistance needed to correctly position infant at breast and maintain  latch.  LATCH Score: 6   Lactation Tools Discussed/Used Tools: Pump;Flanges Flange Size: 18 (MOB was switched from 24 mm to 18 mm breast flange.) Breast pump type: Double-Electric Breast Pump Pump Education: Setup, frequency, and cleaning;Milk Storage Pumping frequency: MOB will contiue to use DEBP every 3 hours for 15 minutes on inital setting. Pumped volume: 5 mL  Interventions Interventions: Assisted with latch;Skin to skin;Reverse pressure;Breast compression;Adjust position;Support pillows;Position options;Education;LPT handout/interventions;CDC Guidelines for Breast Pump Cleaning  Discharge    Consult Status Consult Status: Follow-up Date: 06/24/23 Follow-up type: In-patient    Frederico Hamman 06/23/2023, 9:57 PM

## 2023-06-23 NOTE — Progress Notes (Signed)
Postpartum Progress Note  POD#1 s/p ePCS for cholestasis, oligo  S: Patient seen and examined at bedside. Reports feeling overall well. Pain well controlled, ambulating, tolerating regular diet, voiding without issue. Passing flatus, has not yet had bowel movement. Denies fever, chills, chest pain, shortness of breath.  Feeding: plans to breastfeed Circ: N/A - female neonate  O:  Vitals:   06/23/23 0025 06/23/23 0600  BP: (!) 117/55 (!) 130/54  Pulse: 74 94  Resp: 18 18  Temp: 98.4 F (36.9 C) 97.7 F (36.5 C)  SpO2: 98% 98%    PE:  GA: well appearing, NAD CV: RRR, normal S1, S2 Lungs: CTAB Abd: soft, appropriately tender, fundus firm below umbilicus Inc: dressing in place, clean/dry/intact Peri: moderate lochia Ext: no TTP, +1 non-pitting edema  Labs:  Lab Results  Component Value Date   WBC 11.0 (H) 06/23/2023   HGB 9.6 (L) 06/23/2023   HCT 28.3 (L) 06/23/2023   MCV 86.8 06/23/2023   PLT 271 06/23/2023   Lab Results  Component Value Date   CREATININE 0.77 06/16/2023    A/P:  27 y.o.yo G2P1011 POD#1 s/p ePCS (EBL 500 mL) with severe ICP in pregnancy, pre-gestational T1DM with insulin pump doing well and progressing appropriately. Vitals within normal limits. Physical exam benign. Labs notable for acute blood loss anemia, clinically significant, otherwise normal. Plan as follows:   #Routine OB - Regular diet, HLIV - ERAS for pain control  - Rh positive - DVT ppx: ambulating - BCM: Unsure  #Neonate -plans to breastfeed   #T1DM - Insulin pump in situ - Glycemic control team following, last seen yesterday, appreciate recs  - FS BID    Marlene Bast, MD

## 2023-06-24 LAB — GLUCOSE, CAPILLARY: Glucose-Capillary: 102 mg/dL — ABNORMAL HIGH (ref 70–99)

## 2023-06-24 MED ORDER — SIMETHICONE 80 MG PO CHEW: 80.0000 mg | CHEWABLE_TABLET | Freq: Three times a day (TID) | ORAL | 0 refills | Status: AC

## 2023-06-24 MED ORDER — IBUPROFEN 600 MG PO TABS: 600.0000 mg | ORAL_TABLET | Freq: Four times a day (QID) | ORAL | 0 refills | Status: AC

## 2023-06-24 MED ORDER — TETANUS-DIPHTH-ACELL PERTUSSIS 5-2.5-18.5 LF-MCG/0.5 IM SUSY
0.5000 mL | PREFILLED_SYRINGE | Freq: Once | INTRAMUSCULAR | Status: AC
  Administered 2023-06-24: 0.5 mL via INTRAMUSCULAR

## 2023-06-24 MED ORDER — ACETAMINOPHEN 500 MG PO TABS: 1000.0000 mg | ORAL_TABLET | Freq: Four times a day (QID) | ORAL | 0 refills | Status: AC

## 2023-06-24 MED ORDER — SENNOSIDES-DOCUSATE SODIUM 8.6-50 MG PO TABS: 2.0000 | ORAL_TABLET | Freq: Every day | ORAL | 0 refills | Status: AC

## 2023-06-24 NOTE — Progress Notes (Signed)
Postpartum Progress Note  POD#2 s/p ePCS for cholestasis, oligo  S: Patient seen and examined at bedside. Reports feeling overall well. Pain well controlled, ambulating, tolerating regular diet, voiding without issue. Passing flatus and had bowel movement. Denies fever, chills, chest pain, shortness of breath.  Feeding: plans to breastfeed Circ: N/A - female neonate  O:  Vitals:   06/23/23 2026 06/24/23 0552  BP: (!) 100/56 (!) 107/59  Pulse: 88 86  Resp: 18 18  Temp: 98.5 F (36.9 C) 98.4 F (36.9 C)  SpO2: 97% 98%    PE:  GA: well appearing, NAD CV: RRR, normal S1, S2 Lungs: CTAB Abd: soft, appropriately tender, fundus firm below umbilicus Inc: dressing in place, clean/dry/intact Peri: moderate lochia Ext: no TTP, +1 non-pitting edema  Labs:  Lab Results  Component Value Date   WBC 11.0 (H) 06/23/2023   HGB 9.6 (L) 06/23/2023   HCT 28.3 (L) 06/23/2023   MCV 86.8 06/23/2023   PLT 271 06/23/2023   Lab Results  Component Value Date   CREATININE 0.77 06/16/2023    A/P:  27 y.o.yo G2P1011 POD#2 s/p  ePCS (EBL 500 mL) with severe ICP in pregnancy, pre-gestational T1DM with insulin pump doing well and progressing appropriately. Vitals within normal limits. Physical exam benign. Labs notable for acute blood loss anemia, clinically significant, otherwise normal. Plan as follows:   #Routine OB - Regular diet, HLIV - ERAS for pain control  - Rh positive - DVT ppx: ambulating - BCM: Unsure  #Neonate -plans to breastfeed    #T1DM - Insulin pump in situ - Glycemic control team following, appreciate recs  - FS BID   Anticipate discharge home today.   Marlene Bast, MD

## 2023-06-24 NOTE — Lactation Note (Signed)
This note was copied from a baby's chart. Lactation Consultation Note  Patient Name: Joan Gonzalez Date: 06/24/2023 Age:28 hours Reason for consult: Follow-up assessment;1st time breastfeeding;Primapara;Early term 37-38.6wks  P1, 37 wks, mom pumping with LC arrival.  Praised mom- pumping right breast- per mom does one @ a time- encouraged mom that breats work together and we will have more let downs when we do both sides together- one may leak while the other pumps. Encouraged nipple care, coconut oil provided- discussed use after breastfeeding or pumping. Thick colostrum visualized- discussed water and fatty cheesecake make up of milk.  Encouraged mom to call [facility] assist anytime desired.  Maternal Data Has patient been taught Hand Expression?: Yes Does the patient have breastfeeding experience prior to this delivery?: No  Feeding Mother's Current Feeding Choice: Breast Milk and Formula   Lactation Tools Discussed/Used Tools: Pump;Coconut oil Breast pump type: Double-Electric Breast Pump Pumping frequency: Every 3 hours if baby does not come to breast  Interventions Interventions: Expressed milk;Coconut oil;DEBP;Education  Discharge    Consult Status Date: 06/25/23 Follow-up type: In-patient    [facility] 06/24/2023, 10:55 AM

## 2023-06-24 NOTE — Discharge Instructions (Signed)
Congratulations! We hope you have a wonderful postpartum period. We will plan to see you in the office in 1-2 and 6 weeks. Please call the office if you experience fevers (>100.4 F), heavy vaginal bleeding (using >2 pads/hour), severe pain not responsive to ibuprofen (Advil or Motrin) and acetaminophen (Tylenol), chest pain, shortness of breath, or if you have pain, redness, or swelling in one or both legs. Mood swings, fatigue, and feeling down can be normal in the first two weeks following birth. If you feel your mood symptoms are severe or lasting longer than two weeks, please call our office. If you have any thoughts of hurting yourself or others please call our office. Please call your pediatrician if you have any concerns about your baby.   You should remove your wound dressing 5 days after delivery. There will be small pieces of tape, called steri strips, underneath the dressing. You can remove these if they begin to peel off on their own, otherwise we will remove them at your 2 week follow-up visit. After removing the dressing please keep the area dry and open to the air. Please call the office if you notice new redness, pain, swelling, or drainage around the incision.

## 2023-06-24 NOTE — Discharge Summary (Signed)
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 yo G2P1011 at [redacted]w[redacted]d was admitted to the hospital 06/22/2023 for scheduled cesarean section with the following indication:Elective Primary.Delivery details are as follows:  Membrane Rupture Time/Date: 8:34 AM,06/22/2023  Delivery Method:C-Section, Low Transverse Operative Delivery:N/A Details of operation can be found in separate operative note.  Patient had a uncomplicated postpartum course.  She is ambulating, tolerating a regular diet, passing flatus, and urinating well. Patient is discharged home in stable condition on  06/24/23        Newborn Data: Birth date:06/22/2023 Birth time:8:34 AM Gender:Female Living status:Living Apgars:9 ,9  Weight:2630 g    Magnesium Sulfate received: No BMZ received: No Rhophylac:No MMR:N/A T-DaP:Given postpartum Flu: No RSV Vaccine received: No Transfusion:No Immunizations administered: Immunization History  Administered Date(s) Administered    Influenza Split 04/17/2016   Influenza,inj,Quad PF,6+ Mos 05/04/2017, 05/06/2018, 04/12/2019, 05/14/2020, 04/22/2021, 05/19/2022   PFIZER(Purple Top)SARS-COV-2 Vaccination 09/14/2019, 10/05/2019   PPD Test 03/27/2017, 02/28/2019, 04/28/2020   Tdap 06/24/2023    Physical exam  Vitals:   06/23/23 0600 06/23/23 1500 06/23/23 2026 06/24/23 0552  BP: (!) 130/54 115/67 (!) 100/56 (!) 107/59  Pulse: 94 79 88 86  Resp: 18 18 18 18   Temp: 97.7 F (36.5 C) 97.8 F (36.6 C) 98.5 F (36.9 C) 98.4 F (36.9 C)  TempSrc: Oral Oral Oral Oral  SpO2: 98%  97% 98%  Weight:      Height:       General: alert, cooperative, and no distress Lochia: appropriate Uterine Fundus: firm Incision: Healing well with no significant drainage, No significant erythema, Dressing is clean, dry, and intact DVT Evaluation: No evidence of DVT seen on physical exam. Labs: Lab Results  Component Value Date   WBC 11.0 (H) 06/23/2023   HGB 9.6 (L) 06/23/2023   HCT 28.3 (L) 06/23/2023   MCV 86.8 06/23/2023   PLT 271 06/23/2023      Latest Ref Rng & Units 06/16/2023    7:15 AM  CMP  Glucose 70 - 99 mg/dL 85   BUN 6 - 20 mg/dL 15   Creatinine 5.62 - 1.00 mg/dL 1.30   Sodium 865 - 784 mmol/L 137   Potassium 3.5 - 5.1 mmol/L 4.3   Chloride 98 - 111 mmol/L 106   CO2 22 - 32 mmol/L 20   Calcium 8.9 - 10.3 mg/dL 8.9   Total Protein 6.5 - 8.1 g/dL 6.2   Total  Bilirubin <1.2 mg/dL 0.9   Alkaline Phos 38 - 126 U/L 204   AST 15 - 41 U/L 57   ALT 0 - 44 U/L 73    Edinburgh Score:    06/23/2023    9:23 AM  Edinburgh Postnatal Depression Scale Screening Tool  I have been able to laugh and see the funny side of things. 0  I have looked forward with enjoyment to things. 0  I have blamed myself unnecessarily when things went wrong. 2  I have been anxious or worried for no good reason. 1  I have felt scared or panicky for no good reason. 1  Things have been getting on top of me. 0  I have been so unhappy that I  have had difficulty sleeping. 0  I have felt sad or miserable. 1  I have been so unhappy that I have been crying. 0  The thought of harming myself has occurred to me. 0  Edinburgh Postnatal Depression Scale Total 5      After visit meds:  Allergies as of 06/24/2023       Reactions   Lexapro [escitalopram] Other (See Comments)   Racing thoughts; did not like how it made her feel   Other Rash   Adhesive tape        Medication List     STOP taking these medications    hydrOXYzine 25 MG tablet Commonly known as: ATARAX   ursodiol 300 MG capsule Commonly known as: ACTIGALL       TAKE these medications    acetaminophen 500 MG tablet Commonly known as: TYLENOL Take 2 tablets (1,000 mg total) by mouth every 6 (six) hours.   Dexcom G6 Sensor Misc See admin instructions.   Dexcom G6 Transmitter Misc See admin instructions.   ibuprofen 600 MG tablet Commonly known as: ADVIL Take 1 tablet (600 mg total) by mouth every 6 (six) hours.   insulin lispro 100 UNIT/ML injection Commonly known as: HUMALOG Inject into the skin. Pt uses in pump   insulin pump Soln Inject into the skin.   prenatal multivitamin Tabs tablet Take 1 tablet by mouth daily at 12 noon.   senna-docusate 8.6-50 MG tablet Commonly known as: Senokot-S Take 2 tablets by mouth daily.   simethicone 80 MG chewable tablet Commonly known as: MYLICON Chew 1 tablet (80 mg total) by mouth 3 (three) times daily after meals.         Discharge home in stable condition Infant Feeding: Bottle and Breast Infant Disposition:home with mother Discharge instruction: per After Visit Summary and Postpartum booklet. Activity: Advance as tolerated. Pelvic rest for 6 weeks.  Diet: routine diet Anticipated Birth Control: Unsure Postpartum Appointment:6 weeks Additional Postpartum F/U: Incision check 1 week Future Appointments:No future appointments. Follow up Visit:  Follow-up Information     Olivia Mackie, MD. Schedule an appointment as soon as possible for a visit in 1 week(s).   Specialty: Obstetrics and Gynecology Why: 1w and 6w appt Contact information: [address] [place] [address] [phone]                     06/24/2023 Edger House, MD

## 2023-06-30 ENCOUNTER — Telehealth (HOSPITAL_COMMUNITY): Payer: Self-pay

## 2023-06-30 NOTE — Telephone Encounter (Signed)
06/30/2023 1209  Name: Joan Gonzalez MRN: [id number] DOB: 07/05/1996  Reason for Call:  Transition of Care Hospital Discharge Call  Contact Status: Patient Contact Status: Complete  Language assistant needed:          Follow-Up Questions: Do You Have Any Concerns About Your Health As You Heal From Delivery?: Yes What Concerns Do You Have About Your Health?: Patient reports she has pain off and on in her shoulder and back. She states that her doctor is aware of this pain. She reports having a bowel movement yesterday but states she is not passing gas. RN recommended walking, warm fluids, and eating a good diet. Patient states that she takes motrin sometimes for pain and it helps. RN told patient to reach out to her OB with increased pain or pain that is not well controlled. Patient has no other questions or concerns at this time. Do You Have Any Concerns About Your Infants Health?: Yes What Concerns Do You Have About Your Baby?: Patient reports that baby had lost some weight at her last doctors appointment. She states that she has a feeding plan from her pediatrician and another weight check appointment scheduled. She reports baby is feeding well. Patient has no other questions or concerns.  Edinburgh Postnatal Depression Scale:  In the Past 7 Days: I have been able to laugh and see the funny side of things.: Not quite so much now I have looked forward with enjoyment to things.: Definitely less than I used to I have blamed myself unnecessarily when things went wrong.: Not very often I have been anxious or worried for no good reason.: Yes, sometimes I have felt scared or panicky for no good reason.: No, not at all Things have been getting on top of me.: No, most of the time I have coped quite well I have been so unhappy that I have had difficulty sleeping.: Not at all I have felt sad or miserable.: Not very often I have been so unhappy that I have been crying.: Only occasionally The  thought of harming myself has occurred to me.: Never Edinburgh Postnatal Depression Scale Total: 9  PHQ2-9 Depression Scale:     Discharge Follow-up: Edinburgh score requires follow up?: No (Discussed baby blues vs perinatal mood and anxiety disorders. Encouraged patient to reach out to her OB if she starts to experience increased anxiety, worry, saddness, depression, or not feedling like herself.) Patient was advised of the following resources:: Breastfeeding Support Group, Support Group Patient referred to:: OB  Post-discharge interventions: Reviewed Newborn Safe Sleep Practices  Signature  Signe Colt

## 2023-09-21 DIAGNOSIS — E109 Type 1 diabetes mellitus without complications: Secondary | ICD-10-CM | POA: Diagnosis not present

## 2023-10-21 DIAGNOSIS — E109 Type 1 diabetes mellitus without complications: Secondary | ICD-10-CM | POA: Diagnosis not present

## 2023-11-20 DIAGNOSIS — E109 Type 1 diabetes mellitus without complications: Secondary | ICD-10-CM | POA: Diagnosis not present

## 2023-12-21 DIAGNOSIS — E109 Type 1 diabetes mellitus without complications: Secondary | ICD-10-CM | POA: Diagnosis not present

## 2024-01-21 DIAGNOSIS — E109 Type 1 diabetes mellitus without complications: Secondary | ICD-10-CM | POA: Diagnosis not present

## 2024-02-21 DIAGNOSIS — E109 Type 1 diabetes mellitus without complications: Secondary | ICD-10-CM | POA: Diagnosis not present

## 2024-03-18 DIAGNOSIS — E109 Type 1 diabetes mellitus without complications: Secondary | ICD-10-CM | POA: Diagnosis not present

## 2024-03-18 DIAGNOSIS — Z794 Long term (current) use of insulin: Secondary | ICD-10-CM | POA: Diagnosis not present

## 2024-03-25 ENCOUNTER — Telehealth: Payer: Self-pay | Admitting: Dietician

## 2024-03-25 NOTE — Telephone Encounter (Signed)
 Returned patient call.  She has a new t:slim pump (current pump user but hers needed replaced).  She needs this new pump programmed and to learn how to link this with the Dexcom G7 as she is currently using the Dexcom G6.  Appointment made for me to see her on Thursday.  Joan Gonzalez, RD, LDN, CDCES, DipACLM

## 2024-03-26 ENCOUNTER — Telehealth: Payer: Self-pay | Admitting: Dietician

## 2024-03-27 ENCOUNTER — Ambulatory Visit: Admitting: Dietician

## 2024-03-28 ENCOUNTER — Encounter: Attending: Internal Medicine | Admitting: Dietician

## 2024-03-28 DIAGNOSIS — E109 Type 1 diabetes mellitus without complications: Secondary | ICD-10-CM | POA: Insufficient documentation

## 2024-03-28 NOTE — Progress Notes (Signed)
 Time ofPatient is here today alone.  She has a replacement t:slim pump and needs the settings transferred to this as well as needs to learn how to use the Dexcom G7 and start this with her pump. Endocrinologist:  Dr. Faythe.  The following pump settings were transferred to her new pump: Max bolus 25 units Basal limit:  2.8 Basal rate 1.6 ISF:  1:35 ICR 1:8 Target 110 Duration of insulin  action - 5 hours.  She has 3 segments programmed: 12 am- 4 pm 4 pm to 10 pm 10 pm-12 am  Patient was able to place the Dexcom G7 sensor and start this with her pump. Patient connected her new pump. Old pump was disconnected. G6 app was deleted.  Patient to call for further questions.  Leita Constable, RD, LDN, CDCES, DipACLM

## 2024-04-04 DIAGNOSIS — E109 Type 1 diabetes mellitus without complications: Secondary | ICD-10-CM | POA: Diagnosis not present

## 2024-05-05 DIAGNOSIS — E109 Type 1 diabetes mellitus without complications: Secondary | ICD-10-CM | POA: Diagnosis not present

## 2024-05-28 ENCOUNTER — Ambulatory Visit (INDEPENDENT_AMBULATORY_CARE_PROVIDER_SITE_OTHER): Admitting: Family Medicine

## 2024-05-28 ENCOUNTER — Encounter: Payer: Self-pay | Admitting: Family Medicine

## 2024-05-28 VITALS — BP 112/72 | HR 76 | Ht 62.5 in | Wt 199.0 lb

## 2024-05-28 DIAGNOSIS — D509 Iron deficiency anemia, unspecified: Secondary | ICD-10-CM

## 2024-05-28 DIAGNOSIS — Z0001 Encounter for general adult medical examination with abnormal findings: Secondary | ICD-10-CM | POA: Diagnosis not present

## 2024-05-28 DIAGNOSIS — Z0000 Encounter for general adult medical examination without abnormal findings: Secondary | ICD-10-CM

## 2024-05-28 NOTE — Progress Notes (Signed)
   Subjective:    Patient ID: Joan Gonzalez, female    DOB: Nov 23, 1995, 28 y.o.   MRN: [id number] Here for CPE. HPI Patient is trying hard She is doing a good job as a mother Relates she feels good overall She does relate feeling stressed at times also feels fatigued and tired Denies snoring at night No difficulty with breathing at night husband denies any pauses with her breathing The patient comes in today for a wellness visit.    A review of their health history was completed.  A review of medications was also completed.  Any needed refills; none  Eating habits: Relatively healthy  Falls/  MVA accidents in past few months: None  Regular exercise: Stays physically active  Specialist pt sees on regular basis: Endocrinology  Preventative health issues were discussed.   Additional concerns: Fatigue Regular cycles Denies sleep apnea symptoms    Review of Systems     Objective:   Physical Exam Mild obesity General-in no acute distress Eyes-no discharge Lungs-respiratory rate normal, CTA CV-no murmurs,RRR Extremities skin warm dry no edema Neuro grossly normal Behavior normal, alert        Assessment & Plan:  Adult wellness-complete.wellness physical was conducted today. Importance of diet and exercise were discussed in detail.  Importance of stress reduction and healthy living were discussed.  In addition to this a discussion regarding safety was also covered.  We also reviewed over immunizations and gave recommendations regarding current immunization needed for age.   In addition to this additional areas were also touched on including: Preventative health exams needed:  Colonoscopy not indicated Patient gets all of her lab work through her endocrinologist We will do a CBC along with TIBC ferritin Hold off on any sleep apnea evaluation currently   Patient was advised yearly wellness exam

## 2024-05-30 DIAGNOSIS — D509 Iron deficiency anemia, unspecified: Secondary | ICD-10-CM | POA: Diagnosis not present

## 2024-05-31 ENCOUNTER — Ambulatory Visit: Payer: Self-pay | Admitting: Family Medicine

## 2024-05-31 LAB — CBC WITH DIFFERENTIAL/PLATELET
Basophils Absolute: 0 x10E3/uL (ref 0.0–0.2)
Basos: 0 %
EOS (ABSOLUTE): 0.2 x10E3/uL (ref 0.0–0.4)
Eos: 3 %
Hematocrit: 40.4 % (ref 34.0–46.6)
Hemoglobin: 13.2 g/dL (ref 11.1–15.9)
Immature Grans (Abs): 0 x10E3/uL (ref 0.0–0.1)
Immature Granulocytes: 0 %
Lymphocytes Absolute: 2 x10E3/uL (ref 0.7–3.1)
Lymphs: 29 %
MCH: 29.6 pg (ref 26.6–33.0)
MCHC: 32.7 g/dL (ref 31.5–35.7)
MCV: 91 fL (ref 79–97)
Monocytes Absolute: 0.5 x10E3/uL (ref 0.1–0.9)
Monocytes: 8 %
Neutrophils Absolute: 4 x10E3/uL (ref 1.4–7.0)
Neutrophils: 60 %
Platelets: 441 x10E3/uL (ref 150–450)
RBC: 4.46 x10E6/uL (ref 3.77–5.28)
RDW: 12.3 % (ref 11.7–15.4)
WBC: 6.8 x10E3/uL (ref 3.4–10.8)

## 2024-05-31 LAB — IRON,TIBC AND FERRITIN PANEL
Ferritin: 38 ng/mL (ref 15–150)
Iron Saturation: 23 % (ref 15–55)
Iron: 72 ug/dL (ref 27–159)
Total Iron Binding Capacity: 307 ug/dL (ref 250–450)
UIBC: 235 ug/dL (ref 131–425)

## 2024-06-04 DIAGNOSIS — E109 Type 1 diabetes mellitus without complications: Secondary | ICD-10-CM | POA: Diagnosis not present

## 2024-06-17 DIAGNOSIS — R112 Nausea with vomiting, unspecified: Secondary | ICD-10-CM | POA: Diagnosis not present

## 2024-06-17 DIAGNOSIS — Z794 Long term (current) use of insulin: Secondary | ICD-10-CM | POA: Diagnosis not present

## 2024-06-17 DIAGNOSIS — E109 Type 1 diabetes mellitus without complications: Secondary | ICD-10-CM | POA: Diagnosis not present

## 2024-06-25 DIAGNOSIS — E109 Type 1 diabetes mellitus without complications: Secondary | ICD-10-CM | POA: Diagnosis not present

## 2024-07-04 DIAGNOSIS — E109 Type 1 diabetes mellitus without complications: Secondary | ICD-10-CM | POA: Diagnosis not present
